# Patient Record
Sex: Female | Born: 1935 | Race: White | Hispanic: No | Smoking: Former smoker
Health system: Southern US, Community
[De-identification: ages and names within clinical notes are randomized; demographics above are authoritative.]

## PROBLEM LIST (undated history)

## (undated) DIAGNOSIS — M1990 Unspecified osteoarthritis, unspecified site: Secondary | ICD-10-CM

## (undated) HISTORY — PX: PELVIC FRACTURE SURGERY: SHX119

---

## 2012-10-13 HISTORY — PX: OTHER SURGICAL HISTORY: SHX169

## 2020-01-28 ENCOUNTER — Encounter (HOSPITAL_COMMUNITY): Payer: Self-pay | Admitting: Internal Medicine

## 2020-01-28 ENCOUNTER — Inpatient Hospital Stay (HOSPITAL_COMMUNITY): Payer: Medicare Other

## 2020-01-28 ENCOUNTER — Inpatient Hospital Stay (HOSPITAL_COMMUNITY)
Admission: AD | Admit: 2020-01-28 | Discharge: 2020-02-02 | DRG: 481 | Disposition: A | Discharge status: Skilled Nursing Facility | Payer: Medicare Other | Source: Other Acute Inpatient Hospital | Attending: Family Medicine | Admitting: Family Medicine

## 2020-01-28 DIAGNOSIS — S91302A Unspecified open wound, left foot, initial encounter: Secondary | ICD-10-CM | POA: Diagnosis present

## 2020-01-28 DIAGNOSIS — Z20822 Contact with and (suspected) exposure to covid-19: Secondary | ICD-10-CM | POA: Diagnosis present

## 2020-01-28 DIAGNOSIS — I872 Venous insufficiency (chronic) (peripheral): Secondary | ICD-10-CM | POA: Diagnosis present

## 2020-01-28 DIAGNOSIS — S91301A Unspecified open wound, right foot, initial encounter: Secondary | ICD-10-CM | POA: Diagnosis present

## 2020-01-28 DIAGNOSIS — D62 Acute posthemorrhagic anemia: Secondary | ICD-10-CM | POA: Diagnosis present

## 2020-01-28 DIAGNOSIS — M79651 Pain in right thigh: Secondary | ICD-10-CM | POA: Diagnosis present

## 2020-01-28 DIAGNOSIS — D649 Anemia, unspecified: Secondary | ICD-10-CM

## 2020-01-28 DIAGNOSIS — M810 Age-related osteoporosis without current pathological fracture: Secondary | ICD-10-CM | POA: Diagnosis present

## 2020-01-28 DIAGNOSIS — M1611 Unilateral primary osteoarthritis, right hip: Secondary | ICD-10-CM | POA: Diagnosis present

## 2020-01-28 DIAGNOSIS — Y92000 Kitchen of unspecified non-institutional (private) residence as  the place of occurrence of the external cause: Secondary | ICD-10-CM | POA: Diagnosis not present

## 2020-01-28 DIAGNOSIS — W1839XA Other fall on same level, initial encounter: Secondary | ICD-10-CM | POA: Diagnosis present

## 2020-01-28 DIAGNOSIS — Z87891 Personal history of nicotine dependence: Secondary | ICD-10-CM

## 2020-01-28 DIAGNOSIS — Z419 Encounter for procedure for purposes other than remedying health state, unspecified: Secondary | ICD-10-CM

## 2020-01-28 DIAGNOSIS — S72401A Unspecified fracture of lower end of right femur, initial encounter for closed fracture: Secondary | ICD-10-CM | POA: Diagnosis present

## 2020-01-28 HISTORY — DX: Unspecified osteoarthritis, unspecified site: M19.90

## 2020-01-28 LAB — CBC
HCT: 23.7 % — ABNORMAL LOW (ref 36.0–46.0)
Hemoglobin: 7.1 g/dL — ABNORMAL LOW (ref 12.0–15.0)
MCH: 28.6 pg (ref 26.0–34.0)
MCHC: 30 g/dL (ref 30.0–36.0)
MCV: 95.6 fL (ref 80.0–100.0)
Platelets: 279 10*3/uL (ref 150–400)
RBC: 2.48 MIL/uL — ABNORMAL LOW (ref 3.87–5.11)
RDW: 13.4 % (ref 11.5–15.5)
WBC: 11 10*3/uL — ABNORMAL HIGH (ref 4.0–10.5)
nRBC: 0 % (ref 0.0–0.2)

## 2020-01-28 LAB — BASIC METABOLIC PANEL
Anion gap: 4 — ABNORMAL LOW (ref 5–15)
BUN: 28 mg/dL — ABNORMAL HIGH (ref 8–23)
CO2: 26 mmol/L (ref 22–32)
Calcium: 8.1 mg/dL — ABNORMAL LOW (ref 8.9–10.3)
Chloride: 110 mmol/L (ref 98–111)
Creatinine, Ser: 1.1 mg/dL — ABNORMAL HIGH (ref 0.44–1.00)
GFR calc Af Amer: 52 mL/min — ABNORMAL LOW (ref >60)
GFR calc non Af Amer: 44 mL/min — ABNORMAL LOW (ref >60)
Glucose, Bld: 116 mg/dL — ABNORMAL HIGH (ref 70–99)
Potassium: 4.5 mmol/L (ref 3.5–5.1)
Sodium: 140 mmol/L (ref 135–145)

## 2020-01-28 LAB — PROTIME-INR
INR: 1.2 (ref 0.8–1.2)
Prothrombin Time: 14.7 seconds (ref 11.4–15.2)

## 2020-01-28 MED ORDER — ACETAMINOPHEN 325 MG PO TABS
650.0000 mg | ORAL_TABLET | Freq: Four times a day (QID) | ORAL | Status: DC | PRN
  Filled 2020-01-28: qty 2

## 2020-01-28 MED ORDER — MORPHINE SULFATE (PF) 2 MG/ML IV SOLN
0.5000 mg | INTRAVENOUS | Status: DC | PRN
  Administered 2020-01-29: 0.5 mg via INTRAVENOUS
  Filled 2020-01-28: qty 1

## 2020-01-28 MED ORDER — SENNOSIDES-DOCUSATE SODIUM 8.6-50 MG PO TABS: 1.0000 | ORAL_TABLET | Freq: Every evening | ORAL | Status: DC | PRN

## 2020-01-28 MED ORDER — HYDROCODONE-ACETAMINOPHEN 5-325 MG PO TABS
1.0000 | ORAL_TABLET | Freq: Four times a day (QID) | ORAL | Status: DC | PRN
  Administered 2020-01-30 – 2020-01-31 (×3): 1 via ORAL
  Filled 2020-01-28 (×4): qty 1

## 2020-01-28 NOTE — H&P (Signed)
History and Physical    Cassandra Drake SAY:[id number] DOB: [birth year] DOA: 01/28/2020  PCP: Annabell Howells, PA-C  Patient coming from: [place] have personally briefly reviewed patient's old medical records in [place]  Chief Complaint: Right leg pain after a fall  HPI: Cassandra Drake is a 86 y.o. female with medical history significant for osteoarthritis, osteoporosis, and lower extremity venous insufficiency who presented to [facility] ED for evaluation of right leg pain after a fall.  Patient states she normally ambulates with the use of a cane or walker.  She says around 5 AM on the morning of 01/28/2020 she woke up and was going to the pantry to get some coffee.  She was using a cane at the time when her right leg gave out underneath her and she landed "with the force of the Titanic" onto her right side.  She had significant pain was unable to stand on her own power.  She denies any injury to her head or loss of consciousness.  She was able to contact her son who came to her home and she was taken to [facility] ED for further evaluation.  She does state that she has been recently treated for a wound on the dorsal aspect of her left foot which she says is now healed.  Both of her feet are wrapped from her wound center.  Right hip x-ray 01/28/2020 showed a displaced fracture of the distal right femur, at the level of the distal diaphysis, with overlapping of the fracture fragments. Advanced degenerative osteoarthritis of the right hip joint noted. No displaced fracture is seen within the osseous pelvis or about the right hip.  Right knee x-ray showed a displaced and angulated oblique fracture of the distal femoral diaphysis. Mild dorsal angulation and approximately 9 mm lateral displacement of the distal fracture fragment is noted with no dislocation.  Labs were notable for INR 1.0, sodium 140, potassium 4.6, bicarb 27.6, BUN 32, creatinine 1.07, serum glucose 92, WBC  13.7, hemoglobin 7.9, platelets 319,000.  Right knee immobilizer was put in place.  The ED physician discussed the case with on-call orthopedics at [facility] who recommended transfer.  The hospitalist service was consulted to admit for further evaluation and management.  Review of Systems: All systems reviewed and are negative except as documented in history of present illness above.   Past Medical History:  Diagnosis Date  . Osteoarthritis     Past Surgical History:  Procedure Laterality Date  . Left hip surgery  2014  . PELVIC FRACTURE SURGERY      Social History:  reports that she has quit smoking. She has never used smokeless tobacco. She reports previous alcohol use. She reports previous drug use.  Not on File  Family History  Problem Relation Age of Onset  . Irregular heart beat Mother      Prior to Admission medications   Not on File    Physical Exam: Vitals:   01/28/20 2020  Pulse: 94  Resp: 16  Temp: 98.7 F (37.1 C)  TempSrc: Oral  SpO2: 94%   Constitutional: Thin elderly woman resting supine in bed, NAD, calm, comfortable Eyes: PERRL, lids and conjunctivae normal ENMT: Mucous membranes are moist. Posterior pharynx clear of any exudate or lesions. Neck: normal, supple, no masses. Respiratory: clear to auscultation anteriorly. Normal respiratory effort. No accessory muscle use.  Cardiovascular: Regular rate and rhythm, no murmurs / rubs / gallops. No extremity edema.  Abdomen: no tenderness, no  masses palpated. No hepatosplenomegaly. Bowel sounds positive.  Musculoskeletal: RLE ROM diminished due to femoral fracture and pain.  Right knee immobilizer in place.   Skin: Surgical dressing wrapping in place bilateral lower feet. Neurologic: CN 2-12 grossly intact. Sensation intact, Strength diminished RLE due to right femur fracture/pain. Psychiatric: Awake and alert.  She is oriented to self and year but not place.  Labs on Admission: I have personally  reviewed following labs and imaging studies  CBC: No results for input(s): WBC, NEUTROABS, HGB, HCT, MCV, PLT in the last 168 hours. Basic Metabolic Panel: No results for input(s): NA, K, CL, CO2, GLUCOSE, BUN, CREATININE, CALCIUM, MG, PHOS in the last 168 hours. GFR: CrCl cannot be calculated (No successful lab value found.). Liver Function Tests: No results for input(s): AST, ALT, ALKPHOS, BILITOT, PROT, ALBUMIN in the last 168 hours. No results for input(s): LIPASE, AMYLASE in the last 168 hours. No results for input(s): AMMONIA in the last 168 hours. Coagulation Profile: No results for input(s): INR, PROTIME in the last 168 hours. Cardiac Enzymes: No results for input(s): CKTOTAL, CKMB, CKMBINDEX, TROPONINI in the last 168 hours. BNP (last 3 results) No results for input(s): PROBNP in the last 8760 hours. HbA1C: No results for input(s): HGBA1C in the last 72 hours. CBG: No results for input(s): GLUCAP in the last 168 hours. Lipid Profile: No results for input(s): CHOL, HDL, LDLCALC, TRIG, CHOLHDL, LDLDIRECT in the last 72 hours. Thyroid Function Tests: No results for input(s): TSH, T4TOTAL, FREET4, T3FREE, THYROIDAB in the last 72 hours. Anemia Panel: No results for input(s): VITAMINB12, FOLATE, FERRITIN, TIBC, IRON, RETICCTPCT in the last 72 hours. Urine analysis: No results found for: COLORURINE, APPEARANCEUR, LABSPEC, PHURINE, GLUCOSEU, HGBUR, BILIRUBINUR, KETONESUR, PROTEINUR, UROBILINOGEN, NITRITE, LEUKOCYTESUR  Radiological Exams on Admission: No results found.  EKG: Ordered and pending.  Assessment/Plan Principal Problem:   Closed fracture of distal end of right femur, initial encounter (HCC) Active Problems:   Anemia  Cassandra Drake is a 86 y.o. female with medical history significant for osteoarthritis, osteoporosis, and lower extremity venous insufficiency who is admitted from [facility] ED for management of right distal femur fracture.  Right distal femur  fracture: Occurring after a mechanical fall.  She has a prior history of left hip and pelvic fractures per son.  She has no known prior history of ischemic heart disease, CHF, or cerebrovascular disease.  She would be considered a low risk surgical candidate. -Orthopedics, Dr. Susa Simmonds aware and will see patient -Repeat right femur x-rays ordered -Keep n.p.o. at midnight -Hold pharmacologic VTE prophylaxis -Continue pain control as needed with hold parameters  Anemia: Hemoglobin outside hospital was noted to be 7.9.  There is no obvious bleeding.  Will repeat labs and transfuse with PRBC if needed.  DVT prophylaxis: SCDs Code Status: Full code, confirmed with patient and son Family Communication: Discussed with son by phone Disposition Plan: From home, discharge pending orthopedic evaluation/intervention and postoperative recovery, PT/OT evaluations Consults called: Orthopedics Admission status:  Status is: Inpatient  Remains inpatient appropriate because:Requiring inpatient orthopedic evaluation/intervention for right femur fracture.   Dispo: The patient is from: Home              Anticipated d/c is to: SNF versus home pending postoperative recovery and PT/OT evaluation              Anticipated d/c date is: 3 days              Patient currently is not medically stable  to d/c.    Darreld Mclean MD [facility]  If 7PM-7AM, please contact night-coverage [email or web address]  01/28/2020, 9:45 PM

## 2020-01-29 ENCOUNTER — Encounter (HOSPITAL_COMMUNITY)
Admission: AD | Disposition: A | Discharge status: Skilled Nursing Facility | Payer: Self-pay | Source: Other Acute Inpatient Hospital | Attending: Internal Medicine

## 2020-01-29 ENCOUNTER — Inpatient Hospital Stay (HOSPITAL_COMMUNITY): Payer: Medicare Other | Admitting: Certified Registered"

## 2020-01-29 ENCOUNTER — Inpatient Hospital Stay (HOSPITAL_COMMUNITY): Payer: Medicare Other

## 2020-01-29 ENCOUNTER — Other Ambulatory Visit: Payer: Self-pay

## 2020-01-29 ENCOUNTER — Encounter (HOSPITAL_COMMUNITY): Payer: Self-pay | Admitting: Internal Medicine

## 2020-01-29 HISTORY — PX: FEMUR IM NAIL: SHX1597

## 2020-01-29 LAB — CBC
HCT: 28.6 % — ABNORMAL LOW (ref 36.0–46.0)
Hemoglobin: 8.7 g/dL — ABNORMAL LOW (ref 12.0–15.0)
MCH: 29.1 pg (ref 26.0–34.0)
MCHC: 30.4 g/dL (ref 30.0–36.0)
MCV: 95.7 fL (ref 80.0–100.0)
Platelets: 282 10*3/uL (ref 150–400)
RBC: 2.99 MIL/uL — ABNORMAL LOW (ref 3.87–5.11)
RDW: 13.7 % (ref 11.5–15.5)
WBC: 10.6 10*3/uL — ABNORMAL HIGH (ref 4.0–10.5)
nRBC: 0 % (ref 0.0–0.2)

## 2020-01-29 LAB — BASIC METABOLIC PANEL
Anion gap: 7 (ref 5–15)
BUN: 25 mg/dL — ABNORMAL HIGH (ref 8–23)
CO2: 27 mmol/L (ref 22–32)
Calcium: 8.4 mg/dL — ABNORMAL LOW (ref 8.9–10.3)
Chloride: 109 mmol/L (ref 98–111)
Creatinine, Ser: 1.12 mg/dL — ABNORMAL HIGH (ref 0.44–1.00)
GFR calc Af Amer: 50 mL/min — ABNORMAL LOW (ref >60)
GFR calc non Af Amer: 44 mL/min — ABNORMAL LOW (ref >60)
Glucose, Bld: 88 mg/dL (ref 70–99)
Potassium: 4.8 mmol/L (ref 3.5–5.1)
Sodium: 143 mmol/L (ref 135–145)

## 2020-01-29 LAB — SARS CORONAVIRUS 2 (TAT 6-24 HRS): SARS Coronavirus 2: NEGATIVE

## 2020-01-29 LAB — VITAMIN D 25 HYDROXY (VIT D DEFICIENCY, FRACTURES): Vit D, 25-Hydroxy: 50.22 ng/mL (ref 30–100)

## 2020-01-29 LAB — PREPARE RBC (CROSSMATCH)

## 2020-01-29 LAB — MRSA PCR SCREENING: MRSA by PCR: POSITIVE — AB

## 2020-01-29 LAB — ABO/RH: ABO/RH(D): A POS

## 2020-01-29 SURGERY — INSERTION, INTRAMEDULLARY ROD, FEMUR, RETROGRADE
Anesthesia: General | Laterality: Right

## 2020-01-29 MED ORDER — ACETAMINOPHEN 500 MG PO TABS: 1000.0000 mg | ORAL_TABLET | Freq: Once | ORAL | Status: DC | PRN

## 2020-01-29 MED ORDER — PROPOFOL 10 MG/ML IV BOLUS
INTRAVENOUS | Status: DC | PRN
  Administered 2020-01-29: 90 mg via INTRAVENOUS

## 2020-01-29 MED ORDER — DEXAMETHASONE SODIUM PHOSPHATE 10 MG/ML IJ SOLN
INTRAMUSCULAR | Status: AC
  Filled 2020-01-29: qty 1

## 2020-01-29 MED ORDER — DOCUSATE SODIUM 100 MG PO CAPS
100.0000 mg | ORAL_CAPSULE | Freq: Two times a day (BID) | ORAL | Status: DC
  Administered 2020-01-30 – 2020-02-02 (×7): 100 mg via ORAL
  Filled 2020-01-29 (×7): qty 1

## 2020-01-29 MED ORDER — SODIUM CHLORIDE 0.9% IV SOLUTION: Freq: Once | INTRAVENOUS | Status: AC

## 2020-01-29 MED ORDER — FENTANYL CITRATE (PF) 100 MCG/2ML IJ SOLN: 25.0000 ug | INTRAMUSCULAR | Status: DC | PRN

## 2020-01-29 MED ORDER — CEFAZOLIN SODIUM-DEXTROSE 1-4 GM/50ML-% IV SOLN
1.0000 g | Freq: Four times a day (QID) | INTRAVENOUS | Status: AC
  Administered 2020-01-29 – 2020-01-30 (×3): 1 g via INTRAVENOUS
  Filled 2020-01-29 (×3): qty 50

## 2020-01-29 MED ORDER — LIDOCAINE 2% (20 MG/ML) 5 ML SYRINGE
INTRAMUSCULAR | Status: DC | PRN
  Administered 2020-01-29: 40 mg via INTRAVENOUS

## 2020-01-29 MED ORDER — DEXAMETHASONE SODIUM PHOSPHATE 10 MG/ML IJ SOLN
INTRAMUSCULAR | Status: DC | PRN
  Administered 2020-01-29: 4 mg via INTRAVENOUS

## 2020-01-29 MED ORDER — ACETAMINOPHEN 10 MG/ML IV SOLN
1000.0000 mg | Freq: Once | INTRAVENOUS | Status: DC | PRN
  Administered 2020-01-29: 1000 mg via INTRAVENOUS

## 2020-01-29 MED ORDER — CHLORHEXIDINE GLUCONATE 4 % EX LIQD
60.0000 mL | Freq: Once | CUTANEOUS | Status: AC
  Administered 2020-01-29: 4 via TOPICAL

## 2020-01-29 MED ORDER — ONDANSETRON HCL 4 MG/2ML IJ SOLN
INTRAMUSCULAR | Status: DC | PRN
  Administered 2020-01-29: 4 mg via INTRAVENOUS

## 2020-01-29 MED ORDER — ACETAMINOPHEN 160 MG/5ML PO SOLN: 1000.0000 mg | Freq: Once | ORAL | Status: DC | PRN

## 2020-01-29 MED ORDER — PHENYLEPHRINE 40 MCG/ML (10ML) SYRINGE FOR IV PUSH (FOR BLOOD PRESSURE SUPPORT)
PREFILLED_SYRINGE | INTRAVENOUS | Status: AC
  Filled 2020-01-29: qty 10

## 2020-01-29 MED ORDER — LACTATED RINGERS IV SOLN: INTRAVENOUS | Status: DC

## 2020-01-29 MED ORDER — FENTANYL CITRATE (PF) 250 MCG/5ML IJ SOLN
INTRAMUSCULAR | Status: DC | PRN
  Administered 2020-01-29 (×3): 50 ug via INTRAVENOUS

## 2020-01-29 MED ORDER — ONDANSETRON HCL 4 MG/2ML IJ SOLN
INTRAMUSCULAR | Status: AC
  Filled 2020-01-29: qty 2

## 2020-01-29 MED ORDER — LIDOCAINE 2% (20 MG/ML) 5 ML SYRINGE
INTRAMUSCULAR | Status: AC
  Filled 2020-01-29: qty 5

## 2020-01-29 MED ORDER — MUPIROCIN 2 % EX OINT
1.0000 "application " | TOPICAL_OINTMENT | Freq: Two times a day (BID) | CUTANEOUS | Status: DC
  Administered 2020-01-29 – 2020-02-02 (×9): 1 via NASAL
  Filled 2020-01-29: qty 22

## 2020-01-29 MED ORDER — CHLORHEXIDINE GLUCONATE CLOTH 2 % EX PADS
6.0000 | MEDICATED_PAD | Freq: Every day | CUTANEOUS | Status: DC
  Administered 2020-01-30 – 2020-02-01 (×3): 6 via TOPICAL

## 2020-01-29 MED ORDER — PHENYLEPHRINE HCL-NACL 10-0.9 MG/250ML-% IV SOLN
INTRAVENOUS | Status: DC | PRN
  Administered 2020-01-29: 25 ug/min via INTRAVENOUS

## 2020-01-29 MED ORDER — PROPOFOL 10 MG/ML IV BOLUS
INTRAVENOUS | Status: AC
  Filled 2020-01-29: qty 20

## 2020-01-29 MED ORDER — OXYCODONE HCL 5 MG/5ML PO SOLN: 5.0000 mg | Freq: Once | ORAL | Status: DC | PRN

## 2020-01-29 MED ORDER — ROCURONIUM BROMIDE 10 MG/ML (PF) SYRINGE
PREFILLED_SYRINGE | INTRAVENOUS | Status: DC | PRN
  Administered 2020-01-29: 50 mg via INTRAVENOUS

## 2020-01-29 MED ORDER — 0.9 % SODIUM CHLORIDE (POUR BTL) OPTIME
TOPICAL | Status: DC | PRN
  Administered 2020-01-29: 1000 mL

## 2020-01-29 MED ORDER — ACETAMINOPHEN 10 MG/ML IV SOLN
INTRAVENOUS | Status: AC
  Filled 2020-01-29: qty 100

## 2020-01-29 MED ORDER — CEFAZOLIN SODIUM-DEXTROSE 2-4 GM/100ML-% IV SOLN
2.0000 g | INTRAVENOUS | Status: AC
  Administered 2020-01-29: 2 g via INTRAVENOUS
  Filled 2020-01-29: qty 100

## 2020-01-29 MED ORDER — OXYCODONE HCL 5 MG PO TABS: 5.0000 mg | ORAL_TABLET | Freq: Once | ORAL | Status: DC | PRN

## 2020-01-29 MED ORDER — FENTANYL CITRATE (PF) 250 MCG/5ML IJ SOLN
INTRAMUSCULAR | Status: AC
  Filled 2020-01-29: qty 5

## 2020-01-29 MED ORDER — HALOPERIDOL LACTATE 5 MG/ML IJ SOLN
5.0000 mg | Freq: Once | INTRAMUSCULAR | Status: AC
  Administered 2020-01-29: 5 mg via INTRAVENOUS
  Filled 2020-01-29: qty 1

## 2020-01-29 MED ORDER — ALBUMIN HUMAN 5 % IV SOLN: INTRAVENOUS | Status: DC | PRN

## 2020-01-29 MED ORDER — PHENYLEPHRINE 40 MCG/ML (10ML) SYRINGE FOR IV PUSH (FOR BLOOD PRESSURE SUPPORT)
PREFILLED_SYRINGE | INTRAVENOUS | Status: DC | PRN
  Administered 2020-01-29: 80 ug via INTRAVENOUS

## 2020-01-29 MED ORDER — ROCURONIUM BROMIDE 10 MG/ML (PF) SYRINGE
PREFILLED_SYRINGE | INTRAVENOUS | Status: AC
  Filled 2020-01-29: qty 30

## 2020-01-29 MED ORDER — PHENYLEPHRINE 40 MCG/ML (10ML) SYRINGE FOR IV PUSH (FOR BLOOD PRESSURE SUPPORT)
PREFILLED_SYRINGE | INTRAVENOUS | Status: AC
  Filled 2020-01-29: qty 20

## 2020-01-29 MED ORDER — SUGAMMADEX SODIUM 200 MG/2ML IV SOLN
INTRAVENOUS | Status: DC | PRN
  Administered 2020-01-29: 160 mg via INTRAVENOUS

## 2020-01-29 SURGICAL SUPPLY — 62 items
BIT DRILL CALIBRATED 4.3MMX365 (DRILL) ×1 IMPLANT
BIT DRILL CROWE PNT TWST 4.5MM (DRILL) ×2 IMPLANT
BLADE SURG 10 STRL SS (BLADE) ×6 IMPLANT
BNDG COHESIVE 4X5 TAN STRL (GAUZE/BANDAGES/DRESSINGS) IMPLANT
BNDG COHESIVE 6X5 TAN STRL LF (GAUZE/BANDAGES/DRESSINGS) ×6 IMPLANT
BNDG ELASTIC 4X5.8 VLCR STR LF (GAUZE/BANDAGES/DRESSINGS) ×3 IMPLANT
BNDG ELASTIC 6X10 VLCR STRL LF (GAUZE/BANDAGES/DRESSINGS) ×3 IMPLANT
BNDG ELASTIC 6X5.8 VLCR STR LF (GAUZE/BANDAGES/DRESSINGS) ×3 IMPLANT
BRUSH SCRUB EZ PLAIN DRY (MISCELLANEOUS) ×6 IMPLANT
CHLORAPREP W/TINT 26 (MISCELLANEOUS) ×3 IMPLANT
COVER MAYO STAND STRL (DRAPES) ×3 IMPLANT
COVER SURGICAL LIGHT HANDLE (MISCELLANEOUS) ×3 IMPLANT
COVER WAND RF STERILE (DRAPES) ×3 IMPLANT
DRAPE C-ARM 35X43 STRL (DRAPES) ×3 IMPLANT
DRAPE C-ARMOR (DRAPES) ×3 IMPLANT
DRAPE HALF SHEET 40X57 (DRAPES) ×6 IMPLANT
DRAPE IMP U-DRAPE 54X76 (DRAPES) ×6 IMPLANT
DRAPE INCISE IOBAN 66X45 STRL (DRAPES) ×3 IMPLANT
DRAPE ORTHO SPLIT 77X108 STRL (DRAPES) ×4
DRAPE SURG 17X23 STRL (DRAPES) ×3 IMPLANT
DRAPE SURG ORHT 6 SPLT 77X108 (DRAPES) ×2 IMPLANT
DRAPE U-SHAPE 47X51 STRL (DRAPES) ×3 IMPLANT
DRILL CALIBRATED 4.3MMX365 (DRILL) ×3
DRILL CROWE POINT TWIST 4.5MM (DRILL) ×6
DRSG MEPILEX BORDER 4X4 (GAUZE/BANDAGES/DRESSINGS) ×3 IMPLANT
DRSG MEPILEX BORDER 4X8 (GAUZE/BANDAGES/DRESSINGS) ×3 IMPLANT
ELECT REM PT RETURN 9FT ADLT (ELECTROSURGICAL) ×3
ELECTRODE REM PT RTRN 9FT ADLT (ELECTROSURGICAL) ×1 IMPLANT
GAUZE XEROFORM 5X9 LF (GAUZE/BANDAGES/DRESSINGS) ×3 IMPLANT
GLOVE BIOGEL M STRL SZ7.5 (GLOVE) ×3 IMPLANT
GLOVE BIOGEL PI IND STRL 8 (GLOVE) ×1 IMPLANT
GLOVE BIOGEL PI INDICATOR 8 (GLOVE) ×2
GOWN STRL REUS W/ TWL LRG LVL3 (GOWN DISPOSABLE) ×1 IMPLANT
GOWN STRL REUS W/ TWL XL LVL3 (GOWN DISPOSABLE) ×1 IMPLANT
GOWN STRL REUS W/TWL LRG LVL3 (GOWN DISPOSABLE) ×2
GOWN STRL REUS W/TWL XL LVL3 (GOWN DISPOSABLE) ×2
GUIDEPIN 3.2X17.5 THRD DISP (PIN) ×3 IMPLANT
GUIDEWIRE BEAD TIP (WIRE) ×3 IMPLANT
KIT BASIN OR (CUSTOM PROCEDURE TRAY) ×3 IMPLANT
KIT TURNOVER KIT B (KITS) ×3 IMPLANT
MANIFOLD NEPTUNE II (INSTRUMENTS) ×3 IMPLANT
NAIL FEM RETRO 9X380 (Orthopedic Implant) ×3 IMPLANT
NS IRRIG 1000ML POUR BTL (IV SOLUTION) ×3 IMPLANT
PACK GENERAL/GYN (CUSTOM PROCEDURE TRAY) ×3 IMPLANT
PAD ARMBOARD 7.5X6 YLW CONV (MISCELLANEOUS) ×6 IMPLANT
PADDING CAST COTTON 6X4 STRL (CAST SUPPLIES) ×3 IMPLANT
SCREW CORT TI DBL LEAD 5X40 (Screw) ×3 IMPLANT
SCREW CORT TI DBL LEAD 5X44 (Screw) ×3 IMPLANT
SCREW CORT TI DBL LEAD 5X46 (Screw) ×3 IMPLANT
SCREW CORT TI DBL LEAD 5X56 (Screw) ×3 IMPLANT
SCREW CORT TI DBL LEAD 5X80 (Screw) ×3 IMPLANT
SCREW CORT TI DBLE LEAD 5X52 (Screw) ×3 IMPLANT
SPONGE DRAIN TRACH 4X4 STRL 2S (GAUZE/BANDAGES/DRESSINGS) ×3 IMPLANT
STAPLER VISISTAT 35W (STAPLE) ×3 IMPLANT
STOCKINETTE IMPERVIOUS LG (DRAPES) ×3 IMPLANT
SUT MON AB 3-0 SH 27 (SUTURE) ×2
SUT MON AB 3-0 SH27 (SUTURE) ×1 IMPLANT
SUT PDS AB 2-0 CT1 27 (SUTURE) ×3 IMPLANT
TOWEL GREEN STERILE (TOWEL DISPOSABLE) ×6 IMPLANT
TOWEL GREEN STERILE FF (TOWEL DISPOSABLE) ×3 IMPLANT
UNDERPAD 30X30 (UNDERPADS AND DIAPERS) ×3 IMPLANT
WATER STERILE IRR 1000ML POUR (IV SOLUTION) ×3 IMPLANT

## 2020-01-29 NOTE — Transfer of Care (Signed)
Immediate Anesthesia Transfer of Care Note  Patient: Cassandra Drake  Procedure(s) Performed: INTRAMEDULLARY (IM) RETROGRADE FEMORAL NAILING (Right )  Patient Location: PACU  Anesthesia Type:General  Level of Consciousness: drowsy  Airway & Oxygen Therapy: Patient Spontanous Breathing and Patient connected to face mask oxygen  Post-op Assessment: Report given to RN and Post -op Vital signs reviewed and stable  Post vital signs: Reviewed and stable  Last Vitals:  Vitals Value Taken Time  BP 124/70 01/29/20 1231  Temp    Pulse 72 01/29/20 1233  Resp 9 01/29/20 1233  SpO2 100 % 01/29/20 1233  Vitals shown include unvalidated device data.  Last Pain:  Vitals:   01/29/20 1230  TempSrc:   PainSc: (P) Asleep      Patients Stated Pain Goal: 3 (01/29/20 0954)  Complications: No apparent anesthesia complications

## 2020-01-29 NOTE — Consult Note (Signed)
Reason for Consult: Right distal femur fracture Referring Physician: Emergency department  Cassandra Drake is an 86 y.o. female.  HPI: Had a fall at home.  She lives alone and uses a walker or cane at baseline.  She had immediate pain and deformity to her thigh and she was brought to the emergency department.  X-rays revealed a distal femur fracture.  She was transferred to [facility] and admitted to the hospitalist service.  Orthopedics was consulted.  She complains of pain in the right thigh.  She was placed in a knee immobilizer which makes it somewhat more comfortable.  She denies any numbness or tingling in the right lower extremity.  Pain is sharp in quality.  She notes swelling.  She denies any upper extremity or left lower extremity pain.  Initial hemoglobin was in the 7 range.  She was typed and crossed.  Hemoglobin this morning is 8.7.  She understands that she may require blood products.  She is okay with this.  Past Medical History:  Diagnosis Date  . Osteoarthritis     Past Surgical History:  Procedure Laterality Date  . Left hip surgery  2014  . PELVIC FRACTURE SURGERY      Family History  Problem Relation Age of Onset  . Irregular heart beat Mother     Social History:  reports that she has quit smoking. She has never used smokeless tobacco. She reports previous alcohol use. She reports previous drug use.  Allergies: No Known Allergies  Medications: I have reviewed the patient's current medications.  Results for orders placed or performed during the hospital encounter of 01/28/20 (from the past 48 hour(s))  Protime-INR     Status: None   Collection Time: 01/28/20  9:48 PM  Result Value Ref Range   Prothrombin Time 14.7 11.4 - 15.2 seconds   INR 1.2 0.8 - 1.2    Comment: (NOTE) INR goal varies based on device and disease states. Performed at [facility], [address], [place], [place] [address]   Basic metabolic panel     Status: Abnormal   Collection Time:  01/28/20  9:48 PM  Result Value Ref Range   Sodium 140 135 - 145 mmol/L   Potassium 4.5 3.5 - 5.1 mmol/L   Chloride 110 98 - 111 mmol/L   CO2 26 22 - 32 mmol/L   Glucose, Bld 116 (H) 70 - 99 mg/dL    Comment: Glucose reference range applies only to samples taken after fasting for at least 8 hours.   BUN 28 (H) 8 - 23 mg/dL   Creatinine, Ser 3.84 (H) 0.44 - 1.00 mg/dL   Calcium 8.1 (L) 8.9 - 10.3 mg/dL   GFR calc non Af Amer 44 (L) >60 mL/min   GFR calc Af Amer 52 (L) >60 mL/min   Anion gap 4 (L) 5 - 15    Comment: Performed at [facility], [address], [place], [place] [address]  CBC     Status: Abnormal   Collection Time: 01/28/20  9:48 PM  Result Value Ref Range   WBC 11.0 (H) 4.0 - 10.5 K/uL   RBC 2.48 (L) 3.87 - 5.11 MIL/uL   Hemoglobin 7.1 (L) 12.0 - 15.0 g/dL   HCT 80.3 (L) 21.2 - 24.8 %   MCV 95.6 80.0 - 100.0 fL   MCH 28.6 26.0 - 34.0 pg   MCHC 30.0 30.0 - 36.0 g/dL   RDW 25.0 03.7 - 04.8 %   Platelets  279 150 - 400 K/uL   nRBC 0.0 0.0 - 0.2 %    Comment: Performed at [facility], [address] [place], [place] [address]  Type and screen [facility]     Status: None (Preliminary result)   Collection Time: 01/28/20  9:48 PM  Result Value Ref Range   ABO/RH(D) A POS    Antibody Screen NEG    Sample Expiration 01/31/2020,2359    Unit Number [id number]    Blood Component Type RED CELLS,LR    Unit division 00    Status of Unit ISSUED    Transfusion Status OK TO TRANSFUSE    Crossmatch Result      Compatible Performed at [facility], [address], [place], [place] [address]   ABO/Rh     Status: None (Preliminary result)   Collection Time: 01/28/20  9:48 PM  Result Value Ref Range   ABO/RH(D)      A POS Performed at [facility], [address] [place], [place] [address]   SARS CORONAVIRUS 2 (TAT 6-24 HRS) Nasopharyngeal Nasopharyngeal Swab     Status: None   Collection Time: 01/28/20 10:26 PM   Specimen:  Nasopharyngeal Swab  Result Value Ref Range   SARS Coronavirus 2 NEGATIVE NEGATIVE    Comment: (NOTE) SARS-CoV-2 target nucleic acids are NOT DETECTED. The SARS-CoV-2 RNA is generally detectable in upper and lower respiratory specimens during the acute phase of infection. Negative results do not preclude SARS-CoV-2 infection, do not rule out co-infections with other pathogens, and should not be used as the sole basis for treatment or other patient management decisions. Negative results must be combined with clinical observations, patient history, and epidemiological information. The expected result is Negative. Fact Sheet for Patients: HairSlick.no Fact Sheet for Healthcare Providers: quierodirigir.com This test is not yet approved or cleared by the [place] FDA and  has been authorized for detection and/or diagnosis of SARS-CoV-2 by FDA under an Emergency Use Authorization (EUA). This EUA will remain  in effect (meaning this test can be used) for the duration of the COVID-19 declaration under Section 56 4(b)(1) of the Act, 21 U.S.C. section 360bbb-3(b)(1), unless the authorization is terminated or revoked sooner. Performed at [facility], [address] [place], [place] [address]   MRSA PCR Screening     Status: Abnormal   Collection Time: 01/28/20 10:28 PM   Specimen: Nasopharyngeal  Result Value Ref Range   MRSA by PCR POSITIVE (A) NEGATIVE    Comment:        The GeneXpert MRSA Assay (FDA approved for NASAL specimens only), is one component of a comprehensive MRSA colonization surveillance program. It is not intended to diagnose MRSA infection nor to guide or monitor treatment for MRSA infections. RESULT CALLED TO, READ BACK BY AND VERIFIED WITH: RIMADO,Y RN 0202 01/29/2020 MITCHELL,L   Prepare RBC (crossmatch)     Status: None   Collection Time: 01/29/20 12:13 AM  Result Value Ref Range   Order  Confirmation      ORDER PROCESSED BY BLOOD BANK Performed at [facility], [address] [place], [place] [address]   CBC     Status: Abnormal   Collection Time: 01/29/20  7:48 AM  Result Value Ref Range   WBC 10.6 (H) 4.0 - 10.5 K/uL   RBC 2.99 (L) 3.87 - 5.11 MIL/uL   Hemoglobin 8.7 (L) 12.0 - 15.0 g/dL   HCT 06.2 (L)  36.0 - 46.0 %   MCV 95.7 80.0 - 100.0 fL   MCH 29.1 26.0 - 34.0 pg   MCHC 30.4 30.0 - 36.0 g/dL   RDW 13.7 11.5 - 15.5 %   Platelets 282 150 - 400 K/uL   nRBC 0.0 0.0 - 0.2 %    Comment: Performed at [facility], [place] [address], [place], [place] [address]  Basic metabolic panel     Status: Abnormal   Collection Time: 01/29/20  7:48 AM  Result Value Ref Range   Sodium 143 135 - 145 mmol/L   Potassium 4.8 3.5 - 5.1 mmol/L   Chloride 109 98 - 111 mmol/L   CO2 27 22 - 32 mmol/L   Glucose, Bld 88 70 - 99 mg/dL    Comment: Glucose reference range applies only to samples taken after fasting for at least 8 hours.   BUN 25 (H) 8 - 23 mg/dL   Creatinine, Ser 1.12 (H) 0.44 - 1.00 mg/dL   Calcium 8.4 (L) 8.9 - 10.3 mg/dL   GFR calc non Af Amer 44 (L) >60 mL/min   GFR calc Af Amer 50 (L) >60 mL/min   Anion gap 7 5 - 15    Comment: Performed at [place] [address], [place], [place] [address]    DG FEMUR PORT, MIN 2 VIEWS RIGHT  Result Date: 01/28/2020 CLINICAL DATA:  Distal femur fracture EXAM: RIGHT FEMUR PORTABLE 2 VIEW COMPARISON:  Same day hip and knee radiographs FINDINGS: The bones are diffusely demineralized. There is a mildly comminuted spiral type fracture of the distal femur with foreshortening, lateral displacement and external rotation of the dominant distal fracture fragment. Extensive circumferential soft tissue swelling is noted about the distal femur. Alignment at the knee is grossly maintained on these nondedicated radiographs and suboptimal projection. Mild tricompartmental degenerative changes. Small to moderate knee effusion is  present. Severe osteoarthrosis of the right hip is present with extensive bony remodeling and over coverage of the acetabulum. There are sclerotic features and articular surface collapse of the femoral head which could reflect some underlying osteonecrosis. Remaining bones of the included pelvis are intact. Severe atherosclerotic calcifications are present. IMPRESSION: 1. Mildly comminuted spiral type fracture of the distal femur with foreshortening, lateral displacement and external rotation of the dominant distal fragment. 2. Severe osteoarthrosis of the right hip with sclerotic features and articular surface collapse of the femoral head which could reflect some underlying osteonecrosis. Electronically Signed   By: Lovena Le M.D.   On: 01/28/2020 22:59    Review of Systems  Constitutional: Negative.   HENT: Negative.   Eyes: Negative.   Respiratory: Negative.   Cardiovascular: Negative.   Endocrine: Negative.   Genitourinary: Negative.   Musculoskeletal:       Right thigh pain  Neurological: Negative.   Hematological: Negative.   Psychiatric/Behavioral: Negative.    Blood pressure (!) 114/46, pulse 78, temperature 98.4 F (36.9 C), temperature source Oral, resp. rate 18, SpO2 97 %. Physical Exam  Constitutional: She appears well-developed.  HENT:  Head: Normocephalic.  Eyes: Conjunctivae are normal.  Cardiovascular: Normal rate.  Respiratory: Effort normal.  GI: Soft.  Musculoskeletal:     Cervical back: Neck supple.     Comments: Patient has tenderness to palpation and deformity to the right thigh.  The swelling.  No sign of open fracture.  No tenderness palpation proximally about the hip.  No tenderness distally about the leg, ankle or foot.  She  is able to actively extend flex the ankle.  She endorses sensation light touch about the ankle.  Foot is warm and well-perfused.  No evidence of bilateral upper or left lower extremity injury.  Neurological: She is alert.  Skin: Skin  is warm.  Psychiatric: She has a normal mood and affect.    Assessment/Plan: We will proceed with retrograde nailing of her right distal femur fracture.  She has no other obvious injury.  Femoral neck looks intact but there is certainly some arthritic change within the hip joint.  She understands the risks, benefits and alternatives to surgery which include but not limited to wound healing complication, infection, nonunion, malunion, need for further surgery, damage to surrounding structures and the perioperative and anesthetic risk which include death.  Postoperatively she will be weightbearing as tolerated and will work with physical therapy.  She will likely need SNF placement as she lives alone.  She would like to proceed with surgery.  Terance Hart 01/29/2020, 10:14 AM

## 2020-01-29 NOTE — Anesthesia Postprocedure Evaluation (Signed)
Anesthesia Post Note  Patient: Cassandra Drake  Procedure(s) Performed: INTRAMEDULLARY (IM) RETROGRADE FEMORAL NAILING (Right )     Patient location during evaluation: PACU Anesthesia Type: General Level of consciousness: awake and patient cooperative Pain management: pain level controlled Vital Signs Assessment: post-procedure vital signs reviewed and stable Respiratory status: spontaneous breathing, nonlabored ventilation, respiratory function stable and patient connected to nasal cannula oxygen Cardiovascular status: blood pressure returned to baseline and stable Postop Assessment: no apparent nausea or vomiting Anesthetic complications: no    Last Vitals:  Vitals:   01/29/20 1315 01/29/20 1340  BP: 122/71 113/61  Pulse: 75 78  Resp: 16 16  Temp: 36.5 C 36.6 C  SpO2: 99% 96%    Last Pain:  Vitals:   01/29/20 1340  TempSrc: Oral  PainSc: 0-No pain                 Nicolaus Andel

## 2020-01-29 NOTE — Progress Notes (Signed)
Patient is restless, agitated and started pulling out IV and pulse oximeter. Also pt c/o chest pain and was having some delusions. Morphine 0.5 mg was given. Pt calls out a lot but refusing any nursing care from staffs. Pt tries to pull hair and swings arms to staffs. Pt sustained skin tear on her left anterior arm while resisting care from staffs. Notified TRH Bodenheimer NP of patient's behavior. Haldol 5mg  IV was given to pt per order. Pt has finally calm down and resting. Will continue to monitor patient.

## 2020-01-29 NOTE — Plan of Care (Signed)
  Problem: Education: Goal: Knowledge of General Education information will improve Description: Including pain rating scale, medication(s)/side effects and non-pharmacologic comfort measures Outcome: Progressing   Problem: Pain Managment: Goal: General experience of comfort will improve Outcome: Progressing   Problem: Safety: Goal: Ability to remain free from injury will improve Outcome: Progressing   

## 2020-01-29 NOTE — Anesthesia Preprocedure Evaluation (Signed)
Anesthesia Evaluation  Patient identified by MRN, date of birth, ID band Patient awake    Reviewed: Allergy & Precautions, NPO status , Patient's Chart, lab work & pertinent test results  History of Anesthesia Complications Negative for: history of anesthetic complications  Airway Mallampati: II  TM Distance: >3 FB Neck ROM: Full    Dental  (+) Edentulous Upper, Edentulous Lower, Dental Advisory Given   Pulmonary neg recent URI, former smoker,    breath sounds clear to auscultation       Cardiovascular negative cardio ROS   Rhythm:Regular     Neuro/Psych negative neurological ROS  negative psych ROS   GI/Hepatic negative GI ROS, Neg liver ROS,   Endo/Other  negative endocrine ROS  Renal/GU negative Renal ROS     Musculoskeletal  (+) Arthritis , RIGHT FEMUR FRACTURE   Abdominal   Peds  Hematology  (+) Blood dyscrasia, anemia ,   Anesthesia Other Findings   Reproductive/Obstetrics                             Anesthesia Physical Anesthesia Plan  ASA: I  Anesthesia Plan: General   Post-op Pain Management:    Induction: Intravenous  PONV Risk Score and Plan: 3 and Ondansetron and Dexamethasone  Airway Management Planned: Oral ETT  Additional Equipment: None  Intra-op Plan:   Post-operative Plan: Extubation in OR  Informed Consent: I have reviewed the patients History and Physical, chart, labs and discussed the procedure including the risks, benefits and alternatives for the proposed anesthesia with the patient or authorized representative who has indicated his/her understanding and acceptance.     Dental advisory given  Plan Discussed with: CRNA and Surgeon  Anesthesia Plan Comments:         Anesthesia Quick Evaluation

## 2020-01-29 NOTE — Consult Note (Signed)
WOC Nurse Consult Note: Reason for Consult:left anterior foot chronic, nonhealing wound Wound type: trauma vs venous insufficiency vs arterial insufficiency Pressure Injury POA: Yes Measurement: 4cm x 3.5cm x 0.2xm  Wound DQO:YPOD pink, moist, irregular wound egdes Drainage (amount, consistency, odor) light yellow to pale green on old dressing Periwound: intact, very dry, flaking Dressing procedure/placement/frequency: I will implement an antimicrobial, nonadherent with astringent properties, xeroform with twice daily changes. Pressure injury prevention with Prevalon boots will be implemented as well as a sacral silicone prophylactic dressing.  WOC nursing team will not follow, but will remain available to this patient, the nursing and medical teams.  Please re-consult if needed. Thanks, Ladona Mow, MSN, RN, GNP, [place]  Pager# [phone]

## 2020-01-29 NOTE — Progress Notes (Signed)
PROGRESS NOTE    Cassandra Drake  ENI:[id number] DOB: [birth year] DOA: 01/28/2020 PCP: Devra Dopp, PA-C    Brief Narrative:  Cassandra Drake is a 86 y.o. female with medical history significant for osteoarthritis, osteoporosis, and lower extremity venous insufficiency who presented to [facility] ED for evaluation of right leg pain after a fall.  Patient states she normally ambulates with the use of a cane or walker.  She says around 5 AM on the morning of 01/28/2020 she woke up and was going to the pantry to get some coffee.  She was using a cane at the time when her right leg gave out underneath her and she landed "with the force of the Titanic" onto her right side.  She had significant pain was unable to stand on her own power.  She denies any injury to her head or loss of consciousness.  She was able to contact her son who came to her home and she was taken to [facility] ED for further evaluation.  She does state that she has been recently treated for a wound on the dorsal aspect of her left foot which she says is now healed.  Both of her feet are wrapped from her wound center.  Right hip x-ray 01/28/2020 showed a displaced fracture of the distal right femur, at the level of the distal diaphysis, with overlapping of the fracture fragments. Advanced degenerative osteoarthritis of the right hip joint noted. No displaced fracture is seen within the osseous pelvis or about the right hip.  Right knee x-ray showed a displaced and angulated oblique fracture of the distal femoral diaphysis. Mild dorsal angulation and approximately 9 mm lateral displacement of the distal fracture fragment is noted with no dislocation.  Labs were notable for INR 1.0, sodium 140, potassium 4.6, bicarb 27.6, BUN 32, creatinine 1.07, serum glucose 92, WBC 13.7, hemoglobin 7.9, platelets 319,000.  Right knee immobilizer was put in place.  The ED physician discussed the case with on-call orthopedics at [facility] who recommended transfer.  The hospitalist service was consulted to admit for further evaluation and management.    Assessment & Plan:   Principal Problem:   Closed fracture of distal end of right femur, initial encounter [facility]) Active Problems:   Anemia   Right distal femur fracture Patient presented to [facility] ED following mechanical fall.  Right femur x-ray notable for mildly comminuted spiral fracture distal femur with foreshortening.  Transferred to [facility] for operative management with orthopedics.  She has a prior history of left hip and pelvic fractures per her son. --Orthopedics, Dr. Susa Simmonds following; appreciate assistance --Underwent ORIF today --Postoperative DVT prophylaxis and pain control per orthopedics --PT/OT evaluation postoperatively --Likely will need SNF placement as she lives alone  Anemia Patient was noted to have a hemoglobin of 7.9 on ED presentation at [facility], repeat hemoglobin 7.1.  Transfuse 1 unit PRBC 01/28/2020 with appropriate rise of hemoglobin to 8.7. --Continue to monitor hemoglobin daily --Transfuse for hemoglobin less than 7.0 or active bleeding   DVT prophylaxis: per orthopedics postoperatively, SCDs Code Status: Full code Family Communication: updated patients son via telephone this afternoon  Disposition Plan:  Status is: Inpatient  Remains inpatient appropriate because:Ongoing active pain requiring inpatient pain management, Unsafe d/c plan and Inpatient level of care appropriate due to severity of illness   Dispo: The patient is from: Home              Anticipated d/c is to: SNF  Anticipated d/c date is: 2 days              Patient currently is not medically stable to d/c.   Consultants:   Orthopedics - Dr. Susa Simmonds  Procedures:   ORIF Right femur 01/29/2020 - Dr. Susa Simmonds  Antimicrobials:   Operative cefazolin   Subjective: Patient seen and examined at bedside, just returned from PACU.   Sleeping but easily arousable.  Underwent ORIF right femur fracture.  Denies any pain.  No complaints currently.  Denies headache, no chest pain, palpitations, no shortness of breath, no abdominal pain.  Updated patient's son, [name] via telephone this afternoon.  Objective: Vitals:   01/29/20 1030 01/29/20 1230 01/29/20 1245 01/29/20 1300  BP: (!) 115/47 124/70 131/69 (!) 121/54  Pulse: 80 71 81 73  Resp: 13 20 19 14   Temp:  (!) 97.4 F (36.3 C)    TempSrc:      SpO2: 98% 100% 95% 97%    Intake/Output Summary (Last 24 hours) at 01/29/2020 1331 Last data filed at 01/29/2020 1250 Gross per 24 hour  Intake 1265 ml  Output 775 ml  Net 490 ml   There were no vitals filed for this visit.  Examination:  General exam: Appears calm and comfortable, thin/elderly in appearance Respiratory system: Clear to auscultation. Respiratory effort normal. Cardiovascular system: S1 & S2 heard, RRR. No JVD, murmurs, rubs, gallops or clicks. No pedal edema. Gastrointestinal system: Abdomen is nondistended, soft and nontender. No organomegaly or masses felt. Normal bowel sounds heard. Central nervous system: Alert and oriented. No focal neurological deficits. Extremities: Moving all extremities independently, surgical dressing right thigh in place, clean/dry/intact Skin: No rashes, lesions or ulcers Psychiatry: Judgement and insight appear normal. Mood & affect appropriate.     Data Reviewed: I have personally reviewed following labs and imaging studies  CBC: Recent Labs  Lab 01/28/20 2148 01/29/20 0748  WBC 11.0* 10.6*  HGB 7.1* 8.7*  HCT 23.7* 28.6*  MCV 95.6 95.7  PLT 279 282   Basic Metabolic Panel: Recent Labs  Lab 01/28/20 2148 01/29/20 0748  NA 140 143  K 4.5 4.8  CL 110 109  CO2 26 27  GLUCOSE 116* 88  BUN 28* 25*  CREATININE 1.10* 1.12*  CALCIUM 8.1* 8.4*   GFR: CrCl cannot be calculated (Unknown ideal weight.). Liver Function Tests: No results for input(s): AST,  ALT, ALKPHOS, BILITOT, PROT, ALBUMIN in the last 168 hours. No results for input(s): LIPASE, AMYLASE in the last 168 hours. No results for input(s): AMMONIA in the last 168 hours. Coagulation Profile: Recent Labs  Lab 01/28/20 2148  INR 1.2   Cardiac Enzymes: No results for input(s): CKTOTAL, CKMB, CKMBINDEX, TROPONINI in the last 168 hours. BNP (last 3 results) No results for input(s): PROBNP in the last 8760 hours. HbA1C: No results for input(s): HGBA1C in the last 72 hours. CBG: No results for input(s): GLUCAP in the last 168 hours. Lipid Profile: No results for input(s): CHOL, HDL, LDLCALC, TRIG, CHOLHDL, LDLDIRECT in the last 72 hours. Thyroid Function Tests: No results for input(s): TSH, T4TOTAL, FREET4, T3FREE, THYROIDAB in the last 72 hours. Anemia Panel: No results for input(s): VITAMINB12, FOLATE, FERRITIN, TIBC, IRON, RETICCTPCT in the last 72 hours. Sepsis Labs: No results for input(s): PROCALCITON, LATICACIDVEN in the last 168 hours.  Recent Results (from the past 240 hour(s))  SARS CORONAVIRUS 2 (TAT 6-24 HRS) Nasopharyngeal Nasopharyngeal Swab     Status: None   Collection Time: 01/28/20 10:26 PM  Specimen: Nasopharyngeal Swab  Result Value Ref Range Status   SARS Coronavirus 2 NEGATIVE NEGATIVE Final    Comment: (NOTE) SARS-CoV-2 target nucleic acids are NOT DETECTED. The SARS-CoV-2 RNA is generally detectable in upper and lower respiratory specimens during the acute phase of infection. Negative results do not preclude SARS-CoV-2 infection, do not rule out co-infections with other pathogens, and should not be used as the sole basis for treatment or other patient management decisions. Negative results must be combined with clinical observations, patient history, and epidemiological information. The expected result is Negative. Fact Sheet for Patients: HairSlick.no Fact Sheet for Healthcare  Providers: quierodirigir.com This test is not yet approved or cleared by the [place] FDA and  has been authorized for detection and/or diagnosis of SARS-CoV-2 by FDA under an Emergency Use Authorization (EUA). This EUA will remain  in effect (meaning this test can be used) for the duration of the COVID-19 declaration under Section 56 4(b)(1) of the Act, 21 U.S.C. section 360bbb-3(b)(1), unless the authorization is terminated or revoked sooner. Performed at [facility], [address], [place], [place] [address]   MRSA PCR Screening     Status: Abnormal   Collection Time: 01/28/20 10:28 PM   Specimen: Nasopharyngeal  Result Value Ref Range Status   MRSA by PCR POSITIVE (A) NEGATIVE Final    Comment:        The GeneXpert MRSA Assay (FDA approved for NASAL specimens only), is one component of a comprehensive MRSA colonization surveillance program. It is not intended to diagnose MRSA infection nor to guide or monitor treatment for MRSA infections. RESULT CALLED TO, READ BACK BY AND VERIFIED WITH: RIMADO,Y RN 0202 01/29/2020 MITCHELL,L          Radiology Studies: DG FEMUR PORT, MIN 2 VIEWS RIGHT  Result Date: 01/28/2020 CLINICAL DATA:  Distal femur fracture EXAM: RIGHT FEMUR PORTABLE 2 VIEW COMPARISON:  Same day hip and knee radiographs FINDINGS: The bones are diffusely demineralized. There is a mildly comminuted spiral type fracture of the distal femur with foreshortening, lateral displacement and external rotation of the dominant distal fracture fragment. Extensive circumferential soft tissue swelling is noted about the distal femur. Alignment at the knee is grossly maintained on these nondedicated radiographs and suboptimal projection. Mild tricompartmental degenerative changes. Small to moderate knee effusion is present. Severe osteoarthrosis of the right hip is present with extensive bony remodeling and over coverage of the acetabulum. There  are sclerotic features and articular surface collapse of the femoral head which could reflect some underlying osteonecrosis. Remaining bones of the included pelvis are intact. Severe atherosclerotic calcifications are present. IMPRESSION: 1. Mildly comminuted spiral type fracture of the distal femur with foreshortening, lateral displacement and external rotation of the dominant distal fragment. 2. Severe osteoarthrosis of the right hip with sclerotic features and articular surface collapse of the femoral head which could reflect some underlying osteonecrosis. Electronically Signed   By: Kreg Shropshire M.D.   On: 01/28/2020 22:59        Scheduled Meds: . [MAR Hold] Chlorhexidine Gluconate Cloth  6 each Topical Q0600  . [MAR Hold] mupirocin ointment  1 application Nasal BID   Continuous Infusions: . acetaminophen    . acetaminophen 1,000 mg (01/29/20 1241)  . lactated ringers 10 mL/hr at 01/29/20 0957     LOS: 1 day    Time spent: 35 minutes spent on chart review, discussion with nursing staff, consultants, updating family and interview/physical exam; more than 50% of that time  was spent in counseling and/or coordination of care.    Yarielys Beed J [place] DO [facility] Available via Epic secure chat 7am-7pm After these hours, please refer to coverage provider listed on [email or web address] 01/29/2020, 1:31 PM

## 2020-01-29 NOTE — Anesthesia Procedure Notes (Signed)
Procedure Name: Intubation Date/Time: 01/29/2020 10:56 AM Performed by: Bryson Corona, CRNA Pre-anesthesia Checklist: Patient identified, Emergency Drugs available, Suction available and Patient being monitored Patient Re-evaluated:Patient Re-evaluated prior to induction Oxygen Delivery Method: Circle System Utilized Preoxygenation: Pre-oxygenation with 100% oxygen Induction Type: IV induction Ventilation: Mask ventilation without difficulty Laryngoscope Size: Mac and 3 Grade View: Grade I Tube type: Oral Number of attempts: 1 Airway Equipment and Method: Stylet Placement Confirmation: ETT inserted through vocal cords under direct vision,  positive ETCO2 and breath sounds checked- equal and bilateral Secured at: 22 cm Tube secured with: Tape Dental Injury: Teeth and Oropharynx as per pre-operative assessment

## 2020-01-30 ENCOUNTER — Encounter: Payer: Self-pay | Admitting: *Deleted

## 2020-01-30 LAB — CBC
HCT: 23.2 % — ABNORMAL LOW (ref 36.0–46.0)
Hemoglobin: 7.2 g/dL — ABNORMAL LOW (ref 12.0–15.0)
MCH: 29.5 pg (ref 26.0–34.0)
MCHC: 31 g/dL (ref 30.0–36.0)
MCV: 95.1 fL (ref 80.0–100.0)
Platelets: 242 10*3/uL (ref 150–400)
RBC: 2.44 MIL/uL — ABNORMAL LOW (ref 3.87–5.11)
RDW: 13.5 % (ref 11.5–15.5)
WBC: 15.4 10*3/uL — ABNORMAL HIGH (ref 4.0–10.5)
nRBC: 0 % (ref 0.0–0.2)

## 2020-01-30 LAB — BASIC METABOLIC PANEL
Anion gap: 11 (ref 5–15)
BUN: 25 mg/dL — ABNORMAL HIGH (ref 8–23)
CO2: 24 mmol/L (ref 22–32)
Calcium: 8.1 mg/dL — ABNORMAL LOW (ref 8.9–10.3)
Chloride: 106 mmol/L (ref 98–111)
Creatinine, Ser: 1.13 mg/dL — ABNORMAL HIGH (ref 0.44–1.00)
GFR calc Af Amer: 50 mL/min — ABNORMAL LOW (ref >60)
GFR calc non Af Amer: 43 mL/min — ABNORMAL LOW (ref >60)
Glucose, Bld: 92 mg/dL (ref 70–99)
Potassium: 4.5 mmol/L (ref 3.5–5.1)
Sodium: 141 mmol/L (ref 135–145)

## 2020-01-30 LAB — HEMOGLOBIN AND HEMATOCRIT, BLOOD
HCT: 28.2 % — ABNORMAL LOW (ref 36.0–46.0)
Hemoglobin: 8.7 g/dL — ABNORMAL LOW (ref 12.0–15.0)

## 2020-01-30 LAB — PREPARE RBC (CROSSMATCH)

## 2020-01-30 MED ORDER — QUETIAPINE FUMARATE 25 MG PO TABS
12.5000 mg | ORAL_TABLET | Freq: Every day | ORAL | Status: DC
  Administered 2020-01-30: 12.5 mg via ORAL
  Filled 2020-01-30: qty 1

## 2020-01-30 MED ORDER — SODIUM CHLORIDE 0.9% IV SOLUTION: Freq: Once | INTRAVENOUS | Status: AC

## 2020-01-30 MED ORDER — DIPHENHYDRAMINE HCL 25 MG PO CAPS
25.0000 mg | ORAL_CAPSULE | Freq: Four times a day (QID) | ORAL | Status: DC | PRN
  Administered 2020-01-30 – 2020-01-31 (×3): 25 mg via ORAL
  Filled 2020-01-30 (×4): qty 1

## 2020-01-30 MED ORDER — MELATONIN 3 MG PO TABS
3.0000 mg | ORAL_TABLET | Freq: Every day | ORAL | Status: DC
  Administered 2020-01-30 – 2020-02-01 (×3): 3 mg via ORAL
  Filled 2020-01-30 (×3): qty 1

## 2020-01-30 NOTE — Evaluation (Signed)
Physical Therapy Evaluation Patient Details Name: Cassandra Drake MRN: [id number] DOB: 05-08-1928 Today's Date: 01/30/2020   History of Present Illness  86 y.o. female with medical history significant for osteoarthritis, osteoporosis, and lower extremity venous insufficiency who presented to [facility] ED for evaluation of right leg pain after a fall. Right knee x-ray showed a displaced and angulated oblique fracture of the distal femoral diaphysis. underwent IM nail 01/29/20  Clinical Impression   Patient is s/p above surgery resulting in functional limitations due to the deficits listed below (see PT Problem List). Patient lives alone and reports multiple falls due to rt knee buckling. Today she is limited by weakness, decreased ROM bil LEs, decr balance and pain in RLE. She required 2 person max assist for supine to sit and transfer to the chair. She does not understand why she cannot go home with intermittent assist from her son, however agrees to SNF for therapy "because that's what he (son) says I need to do."  Patient will benefit from skilled PT to increase their independence and safety with mobility to allow discharge to the venue listed below.          Follow Up Recommendations SNF    Equipment Recommendations  None recommended by PT    Recommendations for Other Services       Precautions / Restrictions Precautions Precautions: Fall Precaution Comments: pt reports rt knee buckles with multiple falls PTA Restrictions RLE Weight Bearing: Weight bearing as tolerated      Mobility  Bed Mobility Overal bed mobility: Needs Assistance Bed Mobility: Supine to Sit     Supine to sit: Max assist;+2 for physical assistance;HOB elevated     General bed mobility comments: max assist to laterally scoot hips toward EOB, max assist to help legs over EOB and +2 max assist to raise torso to sitting; total assist to scoot forward in sitting to get feet on the floor    Transfers Overall transfer level: Needs assistance Equipment used: Rolling walker (2 wheeled) Transfers: Sit to/from Stand Sit to Stand: Max assist;+2 physical assistance;+2 safety/equipment         General transfer comment: pt with difficulty bringing Lt foot underneath her due to PF contracture, therefore requiring incr assist to initiate sit to stand; never achieves full knee extension in either LE in standing  Ambulation/Gait Ambulation/Gait assistance: Mod assist;+2 physical assistance;+2 safety/equipment Gait Distance (Feet): 2 Feet Assistive device: Rolling walker (2 wheeled) Gait Pattern/deviations: Step-to pattern;Decreased stride length;Trunk flexed     General Gait Details: pt able to advance her feet, however incr assist to maintain standing with flexed posture and flexed at knees  Stairs            Wheelchair Mobility    Modified Rankin (Stroke Patients Only)       Balance Overall balance assessment: Needs assistance Sitting-balance support: Single extremity supported;Feet supported Sitting balance-Leahy Scale: Poor Sitting balance - Comments: needs bil UE support to maintain balance   Standing balance support: Bilateral upper extremity supported Standing balance-Leahy Scale: Poor Standing balance comment: poor extension of knees with posterior lean                              Pertinent Vitals/Pain Pain Assessment: Faces(pt unable to rate with #) Faces Pain Scale: Hurts even more Pain Location: rt thigh Pain Descriptors / Indicators: Discomfort;Guarding;Grimacing Pain Intervention(s): Limited activity within patient's tolerance;Monitored during session;Repositioned(offered to ask RN for pain meds;  pt refused x 2)    Home Living Family/patient expects to be discharged to:: Private residence Living Arrangements: Alone Available Help at Discharge: Family(son; works in [place]) Type of Home: House Home Access: Stairs to  enter Entrance Stairs-Rails: None Technical brewer of Steps: 2 Home Layout: Two level;Bed/bath upstairs [place] - single point;Walker - 4 wheels      Prior Function Level of Independence: Independent with assistive device(s)         Comments: has been doing her bath at the sink for awhile; walks with cane; reports multiple falls due to rt knee buckles     Hand Dominance        Extremity/Trunk Assessment   Upper Extremity Assessment Upper Extremity Assessment: Generalized weakness;Defer to OT evaluation    Lower Extremity Assessment Lower Extremity Assessment: RLE deficits/detail;LLE deficits/detail;Generalized weakness RLE Deficits / Details: wrapped in ace wrap with ~15 degrees knee flexion with pt unable to tolerate full extension once pillow removed; ankle WNL LLE Deficits / Details: initially did not display full knee extension, however with HOB lowered and tactile cues, she understood and relaxed into full knee extension; supine knee flexion at least 120; ankle PF contracture ~20 degrees which limits ability to get left foot underneath her for sit to stand    Cervical / Trunk Assessment Cervical / Trunk Assessment: Kyphotic  Communication   Communication: No difficulties  Cognition Arousal/Alertness: Awake/alert Behavior During Therapy: Anxious Overall Cognitive Status: No family/caregiver present to determine baseline cognitive functioning                                 General Comments: oriented x 3 (time NT); did not seem to understand why she could not go home and have her son help intermittently (when not working); poor awareness of deficits; poor safety awareness      General Comments General comments (skin integrity, edema, etc.): Patient eager to participate and get OOB "I've been waiting for this all day!" Reports she is always incontinent of urine and normally wears "Depends" Folded bed pad used during transfer with no  leakage noted. Bed was wet.     Exercises     Assessment/Plan    PT Assessment Patient needs continued PT services  PT Problem List Decreased strength;Decreased range of motion;Decreased activity tolerance;Decreased balance;Decreased mobility;Decreased cognition;Decreased knowledge of use of DME;Decreased safety awareness;Pain       PT Treatment Interventions DME instruction;Gait training;Functional mobility training;Therapeutic activities;Therapeutic exercise;Patient/family education    PT Goals (Current goals can be found in the Care Plan section)  Acute Rehab PT Goals Patient Stated Goal: ultimately to go home PT Goal Formulation: With patient Time For Goal Achievement: 02/13/20 Potential to Achieve Goals: Good    Frequency Min 3X/week   Barriers to discharge Inaccessible home environment;Decreased caregiver support 2 steps with no rails; bedroom upstairs; no 24 hour care    Co-evaluation               AM-PAC PT "6 Clicks" Mobility  Outcome Measure Help needed turning from your back to your side while in a flat bed without using bedrails?: Total Help needed moving from lying on your back to sitting on the side of a flat bed without using bedrails?: Total Help needed moving to and from a bed to a chair (including a wheelchair)?: A Lot Help needed standing up from a chair using your arms (e.g., wheelchair or bedside chair)?: A  Lot Help needed to walk in hospital room?: Total Help needed climbing 3-5 steps with a railing? : Total 6 Click Score: 8    End of Session Equipment Utilized During Treatment: Gait belt Activity Tolerance: Patient limited by pain Patient left: in chair;with call bell/phone within reach;with chair alarm set Nurse Communication: Mobility status;Other (comment)(incontinent with wet bed) PT Visit Diagnosis: Other abnormalities of gait and mobility (R26.89);Repeated falls (R29.6);Muscle weakness (generalized) (M62.81);Pain Pain - Right/Left:  Right Pain - part of body: Leg    Time: 6384-6659 PT Time Calculation (min) (ACUTE ONLY): 31 min   Charges:   PT Evaluation $PT Eval Low Complexity: 1 Low PT Treatments $Therapeutic Activity: 8-22 mins         Cassandra Drake, PT Pager [phone]   Cassandra Drake 01/30/2020, 4:28 PM

## 2020-01-30 NOTE — Progress Notes (Signed)
Patient is complaining of itching, patient  Removed all the surgical dressing off, nurse reinforced the dressing and notify attending doctor, will wait for orders and continue to monitor

## 2020-01-30 NOTE — Plan of Care (Signed)
  Problem: Education: Goal: Verbalization of understanding the information provided (i.e., activity precautions, restrictions, etc) will improve Outcome: Adequate for Discharge Goal: Individualized Educational Video(s) Outcome: Adequate for Discharge   Problem: Activity: Goal: Ability to ambulate and perform ADLs will improve Outcome: Adequate for Discharge   

## 2020-01-30 NOTE — Progress Notes (Signed)
     Cassandra Drake is a 86 y.o. female   Orthopaedic diagnosis: Right distal femur fracture status post intramedullary nail  Subjective: Patient is awake and alert this morning.  Denies pain.  Is asking for breakfast and coffee.  Has not mobilized with physical therapy.  Did have some agitation overnight requiring Haldol.  She was seen by wound care for her bilateral foot wounds and dressing was placed.  Objectyive: Vitals:   01/30/20 0300 01/30/20 0830  BP: 111/62 (!) 113/55  Pulse: 70 79  Resp: 15 16  Temp: 98.1 F (36.7 C) 98.2 F (36.8 C)  SpO2: 94% 96%     Exam: Awake and alert Respirations even and unlabored No acute distress  Right knee dressing in place without saturation.  Right hip dressing in place with slight shadowing.  She has a hip flexion contracture.  She is able to wiggle her toes bilaterally.  Endorses sensation light touch about toes.  Hemoglobin: 7.2 -type and cross has been performed  Assessment: Postoperative day 1 status post intramedullary nailing of right distal femur fracture, doing well   Plan: She may weight-bear as tolerated on her right lower extremity.   Bilateral foot wounds per wound care.  Mobilize with physical therapy. Keep dressing in place and bolster if needed Patient is anemic today is 7.2.  Will defer transfusion to hospitalist team.  She will follow-up in 2 weeks for wound check, x-rays and suture removal if appropriate.     Nicki [place], MD

## 2020-01-30 NOTE — Progress Notes (Signed)
PROGRESS NOTE    Cassandra Drake  JKD:[id number] DOB: [birth year] DOA: 01/28/2020 PCP: Devra Dopp, PA-C    Brief Narrative:  Cassandra Drake is a 86 y.o. female with medical history significant for osteoarthritis, osteoporosis, and lower extremity venous insufficiency who presented to [facility] ED for evaluation of right leg pain after a fall.  Patient states she normally ambulates with the use of a cane or walker.  She says around 5 AM on the morning of 01/28/2020 she woke up and was going to the pantry to get some coffee.  She was using a cane at the time when her right leg gave out underneath her and she landed "with the force of the Titanic" onto her right side.  She had significant pain was unable to stand on her own power.  She denies any injury to her head or loss of consciousness.  She was able to contact her son who came to her home and she was taken to [facility] ED for further evaluation.  She does state that she has been recently treated for a wound on the dorsal aspect of her left foot which she says is now healed.  Both of her feet are wrapped from her wound center.  Right hip x-ray 01/28/2020 showed a displaced fracture of the distal right femur, at the level of the distal diaphysis, with overlapping of the fracture fragments. Advanced degenerative osteoarthritis of the right hip joint noted. No displaced fracture is seen within the osseous pelvis or about the right hip.  Right knee x-ray showed a displaced and angulated oblique fracture of the distal femoral diaphysis. Mild dorsal angulation and approximately 9 mm lateral displacement of the distal fracture fragment is noted with no dislocation.  Labs were notable for INR 1.0, sodium 140, potassium 4.6, bicarb 27.6, BUN 32, creatinine 1.07, serum glucose 92, WBC 13.7, hemoglobin 7.9, platelets 319,000.  Right knee immobilizer was put in place.  The ED physician discussed the case with on-call orthopedics at [facility] who recommended transfer.  The hospitalist service was consulted to admit for further evaluation and management.    Assessment & Plan:   Principal Problem:   Closed fracture of distal end of right femur, initial encounter [facility]) Active Problems:   Anemia   Right distal femur fracture Patient presented to [facility] ED following mechanical fall.  Right femur x-ray notable for mildly comminuted spiral fracture distal femur with foreshortening.  Transferred to [facility] for operative management with orthopedics.  She has a prior history of left hip and pelvic fractures per her son.  Patient underwent ORIF/IM nail on 01/29/2020 by Dr. Susa Simmonds. --Postoperative DVT prophylaxis and pain control per orthopedics --PT/OT evaluation pending --Likely will need SNF placement as she lives alone  Anemia Acute postoperative blood loss anemia Patient was noted to have a hemoglobin of 7.9 on ED presentation at [facility], repeat hemoglobin 7.1.  Transfused 1 unit PRBC 01/28/2020 with appropriate rise of hemoglobin to 8.7. --Hgb 7.9-->7.1-->8.7-->7.2 this morning --Transfuse 1 unit PRBC today --Continue to monitor hemoglobin daily --Transfuse for hemoglobin less than 7.0 or active bleeding   DVT prophylaxis: per orthopedics postoperatively, SCDs Code Status: Full code Family Communication: updated patients son via telephone this afternoon  Disposition Plan:  Status is: Inpatient  Remains inpatient appropriate because:Ongoing active pain requiring inpatient pain management, Unsafe d/c plan and Inpatient level of care appropriate due to severity of illness   Dispo: The patient is from: Home  Anticipated d/c is to: SNF              Anticipated d/c date is: 2 days              Patient currently is not medically stable to d/c.   Consultants:   Orthopedics - Dr. Lucia Gaskins  Procedures:   ORIF Right femur 01/29/2020 - Dr. Lucia Gaskins  Antimicrobials:   Operative  cefazolin   Subjective: Patient seen and examined at bedside, resting comfortably.  Denies pain.  Hemoglobin down from 8.7 to 7.2 postoperatively today.  Will transfuse 1 unit PRBC. No complaints this morning other than needs help ordering her breakfast.  Denies headache, no chest pain, palpitations, no shortness of breath, no abdominal pain.  Updated patient's son, Legrand Como via telephone this morning.  Denies headache, no fever/chills/night sweats, no nausea/vomiting/diarrhea, no chest pain, palpitations, no abdominal pain, no shortness of breath, no weakness, no fatigue, no paresthesias.  Objective: Vitals:   01/30/20 0300 01/30/20 0830 01/30/20 1121 01/30/20 1144  BP: 111/62 (!) 113/55 (!) 128/58 130/71  Pulse: 70 79 86 88  Resp: 15 16 16 16   Temp: 98.1 F (36.7 C) 98.2 F (36.8 C) 98.3 F (36.8 C) 98.5 F (36.9 C)  TempSrc: Oral Oral Oral Oral  SpO2: 94% 96% 98% 98%  Weight:      Height:        Intake/Output Summary (Last 24 hours) at 01/30/2020 1211 Last data filed at 01/30/2020 0900 Gross per 24 hour  Intake 870 ml  Output 125 ml  Net 745 ml   Filed Weights   01/29/20 1340  Weight: 43.9 kg    Examination:  General exam: Appears calm and comfortable, thin/elderly in appearance Respiratory system: Clear to auscultation. Respiratory effort normal. Cardiovascular system: S1 & S2 heard, RRR. No JVD, murmurs, rubs, gallops or clicks. No pedal edema. Gastrointestinal system: Abdomen is nondistended, soft and nontender. No organomegaly or masses felt. Normal bowel sounds heard. Central nervous system: Alert and oriented. No focal neurological deficits. Extremities: Moving all extremities independently, surgical dressing right thigh in place with some old blood noted upper surgical wound, otherwise clean/dry/intact Skin: No rashes, lesions or ulcers Psychiatry: Judgement and insight appear normal. Mood & affect appropriate.     Data Reviewed: I have personally reviewed  following labs and imaging studies  CBC: Recent Labs  Lab 01/28/20 2148 01/29/20 0748 01/30/20 0552  WBC 11.0* 10.6* 15.4*  HGB 7.1* 8.7* 7.2*  HCT 23.7* 28.6* 23.2*  MCV 95.6 95.7 95.1  PLT 279 282 161   Basic Metabolic Panel: Recent Labs  Lab 01/28/20 2148 01/29/20 0748 01/30/20 0552  NA 140 143 141  K 4.5 4.8 4.5  CL 110 109 106  CO2 26 27 24   GLUCOSE 116* 88 92  BUN 28* 25* 25*  CREATININE 1.10* 1.12* 1.13*  CALCIUM 8.1* 8.4* 8.1*   GFR: Estimated Creatinine Clearance: 23.4 mL/min (A) (by C-G formula based on SCr of 1.13 mg/dL (H)). Liver Function Tests: No results for input(s): AST, ALT, ALKPHOS, BILITOT, PROT, ALBUMIN in the last 168 hours. No results for input(s): LIPASE, AMYLASE in the last 168 hours. No results for input(s): AMMONIA in the last 168 hours. Coagulation Profile: Recent Labs  Lab 01/28/20 2148  INR 1.2   Cardiac Enzymes: No results for input(s): CKTOTAL, CKMB, CKMBINDEX, TROPONINI in the last 168 hours. BNP (last 3 results) No results for input(s): PROBNP in the last 8760 hours. HbA1C: No results for input(s): HGBA1C in the last  72 hours. CBG: No results for input(s): GLUCAP in the last 168 hours. Lipid Profile: No results for input(s): CHOL, HDL, LDLCALC, TRIG, CHOLHDL, LDLDIRECT in the last 72 hours. Thyroid Function Tests: No results for input(s): TSH, T4TOTAL, FREET4, T3FREE, THYROIDAB in the last 72 hours. Anemia Panel: No results for input(s): VITAMINB12, FOLATE, FERRITIN, TIBC, IRON, RETICCTPCT in the last 72 hours. Sepsis Labs: No results for input(s): PROCALCITON, LATICACIDVEN in the last 168 hours.  Recent Results (from the past 240 hour(s))  SARS CORONAVIRUS 2 (TAT 6-24 HRS) Nasopharyngeal Nasopharyngeal Swab     Status: None   Collection Time: 01/28/20 10:26 PM   Specimen: Nasopharyngeal Swab  Result Value Ref Range Status   SARS Coronavirus 2 NEGATIVE NEGATIVE Final    Comment: (NOTE) SARS-CoV-2 target nucleic acids  are NOT DETECTED. The SARS-CoV-2 RNA is generally detectable in upper and lower respiratory specimens during the acute phase of infection. Negative results do not preclude SARS-CoV-2 infection, do not rule out co-infections with other pathogens, and should not be used as the sole basis for treatment or other patient management decisions. Negative results must be combined with clinical observations, patient history, and epidemiological information. The expected result is Negative. Fact Sheet for Patients: HairSlick.no Fact Sheet for Healthcare Providers: quierodirigir.com This test is not yet approved or cleared by the [place] FDA and  has been authorized for detection and/or diagnosis of SARS-CoV-2 by FDA under an Emergency Use Authorization (EUA). This EUA will remain  in effect (meaning this test can be used) for the duration of the COVID-19 declaration under Section 56 4(b)(1) of the Act, 21 U.S.C. section 360bbb-3(b)(1), unless the authorization is terminated or revoked sooner. Performed at [facility], [address] [place], [place] [address]   MRSA PCR Screening     Status: Abnormal   Collection Time: 01/28/20 10:28 PM   Specimen: Nasopharyngeal  Result Value Ref Range Status   MRSA by PCR POSITIVE (A) NEGATIVE Final    Comment:        The GeneXpert MRSA Assay (FDA approved for NASAL specimens only), is one component of a comprehensive MRSA colonization surveillance program. It is not intended to diagnose MRSA infection nor to guide or monitor treatment for MRSA infections. RESULT CALLED TO, READ BACK BY AND VERIFIED WITH: RIMADO,Y RN 0202 01/29/2020 MITCHELL,L          Radiology Studies: DG Knee 1-2 Views Right  Result Date: 01/29/2020 CLINICAL DATA:  Status post fall. EXAM: DG C-ARM 1-60 MIN; RIGHT KNEE - 1-2 VIEW FLUOROSCOPY TIME:  Fluoroscopy Time:  59 seconds Number of Acquired Spot Images:  10 COMPARISON:  None. FINDINGS: Fluoroscopic images demonstrating intramedullary rod placement in the RIGHT femur, traversing the RIGHT femur fracture site. Rod and screws appear intact and appropriately positioned. Fluoroscopy provided for 59 seconds. IMPRESSION: Intraoperative fluoroscopic images demonstrating intramedullary rod placement in the RIGHT femur, traversing the RIGHT femur fracture site. Electronically Signed   By: Bary Richard M.D.   On: 01/29/2020 15:39   DG C-Arm 1-60 Min  Result Date: 01/29/2020 CLINICAL DATA:  Status post fall. EXAM: DG C-ARM 1-60 MIN; RIGHT KNEE - 1-2 VIEW FLUOROSCOPY TIME:  Fluoroscopy Time:  59 seconds Number of Acquired Spot Images: 10 COMPARISON:  None. FINDINGS: Fluoroscopic images demonstrating intramedullary rod placement in the RIGHT femur, traversing the RIGHT femur fracture site. Rod and screws appear intact and appropriately positioned. Fluoroscopy provided for 59 seconds. IMPRESSION: Intraoperative fluoroscopic images demonstrating intramedullary rod placement in the  RIGHT femur, traversing the RIGHT femur fracture site. Electronically Signed   By: Bary Richard M.D.   On: 01/29/2020 15:39   DG FEMUR PORT, MIN 2 VIEWS RIGHT  Result Date: 01/28/2020 CLINICAL DATA:  Distal femur fracture EXAM: RIGHT FEMUR PORTABLE 2 VIEW COMPARISON:  Same day hip and knee radiographs FINDINGS: The bones are diffusely demineralized. There is a mildly comminuted spiral type fracture of the distal femur with foreshortening, lateral displacement and external rotation of the dominant distal fracture fragment. Extensive circumferential soft tissue swelling is noted about the distal femur. Alignment at the knee is grossly maintained on these nondedicated radiographs and suboptimal projection. Mild tricompartmental degenerative changes. Small to moderate knee effusion is present. Severe osteoarthrosis of the right hip is present with extensive bony remodeling and over coverage of the  acetabulum. There are sclerotic features and articular surface collapse of the femoral head which could reflect some underlying osteonecrosis. Remaining bones of the included pelvis are intact. Severe atherosclerotic calcifications are present. IMPRESSION: 1. Mildly comminuted spiral type fracture of the distal femur with foreshortening, lateral displacement and external rotation of the dominant distal fragment. 2. Severe osteoarthrosis of the right hip with sclerotic features and articular surface collapse of the femoral head which could reflect some underlying osteonecrosis. Electronically Signed   By: Kreg Shropshire M.D.   On: 01/28/2020 22:59        Scheduled Meds: . Chlorhexidine Gluconate Cloth  6 each Topical Q0600  . docusate sodium  100 mg Oral BID  . melatonin  3 mg Oral QHS  . mupirocin ointment  1 application Nasal BID  . QUEtiapine  12.5 mg Oral QHS   Continuous Infusions:    LOS: 2 days    Time spent: 37 minutes spent on chart review, discussion with nursing staff, consultants, updating family and interview/physical exam; more than 50% of that time was spent in counseling and/or coordination of care.    Alvira Philips [place], DO [facility] Available via Epic secure chat 7am-7pm After these hours, please refer to coverage provider listed on [email or web address] 01/30/2020, 12:11 PM

## 2020-01-30 NOTE — Plan of Care (Signed)
  Problem: Education: Goal: Verbalization of understanding the information provided (i.e., activity precautions, restrictions, etc) will improve Outcome: Progressing   Problem: Self-Concept: Goal: Ability to maintain and perform role responsibilities to the fullest extent possible will improve Outcome: Progressing   Problem: Pain Management: Goal: Pain level will decrease Outcome: Progressing   

## 2020-01-30 NOTE — Progress Notes (Addendum)
PT Cancellation Note  Patient Details Name: Cassandra Drake MRN: [id number] DOB: [birth year]   Cancelled Treatment:    Reason Eval/Treat Not Completed: Medical issues which prohibited therapy. Pt with 7.2 Hgb. She is receiving 1 unit PRBC. PT to re-attempt eval as time allows.   Ilda Foil 01/30/2020, 11:56 AM   Aida Raider, PT  Office # [phone] Pager [phone]

## 2020-01-31 LAB — BASIC METABOLIC PANEL
Anion gap: 10 (ref 5–15)
BUN: 22 mg/dL (ref 8–23)
CO2: 25 mmol/L (ref 22–32)
Calcium: 8.5 mg/dL — ABNORMAL LOW (ref 8.9–10.3)
Chloride: 107 mmol/L (ref 98–111)
Creatinine, Ser: 1.05 mg/dL — ABNORMAL HIGH (ref 0.44–1.00)
GFR calc Af Amer: 55 mL/min — ABNORMAL LOW (ref >60)
GFR calc non Af Amer: 47 mL/min — ABNORMAL LOW (ref >60)
Glucose, Bld: 117 mg/dL — ABNORMAL HIGH (ref 70–99)
Potassium: 4.2 mmol/L (ref 3.5–5.1)
Sodium: 142 mmol/L (ref 135–145)

## 2020-01-31 LAB — CBC
HCT: 28.6 % — ABNORMAL LOW (ref 36.0–46.0)
Hemoglobin: 8.9 g/dL — ABNORMAL LOW (ref 12.0–15.0)
MCH: 28.8 pg (ref 26.0–34.0)
MCHC: 31.1 g/dL (ref 30.0–36.0)
MCV: 92.6 fL (ref 80.0–100.0)
Platelets: 246 10*3/uL (ref 150–400)
RBC: 3.09 MIL/uL — ABNORMAL LOW (ref 3.87–5.11)
RDW: 14.7 % (ref 11.5–15.5)
WBC: 13 10*3/uL — ABNORMAL HIGH (ref 4.0–10.5)
nRBC: 0 % (ref 0.0–0.2)

## 2020-01-31 LAB — BPAM RBC
Blood Product Expiration Date: 202105082359
Blood Product Expiration Date: 202105252359
ISSUE DATE / TIME: 202104180114
ISSUE DATE / TIME: 202104191110
Unit Type and Rh: 6200
Unit Type and Rh: 6200

## 2020-01-31 LAB — TYPE AND SCREEN
ABO/RH(D): A POS
Antibody Screen: NEGATIVE
Unit division: 0
Unit division: 0

## 2020-01-31 LAB — MAGNESIUM: Magnesium: 1.9 mg/dL (ref 1.7–2.4)

## 2020-01-31 MED ORDER — QUETIAPINE FUMARATE 25 MG PO TABS
25.0000 mg | ORAL_TABLET | Freq: Every day | ORAL | Status: DC
  Administered 2020-01-31 – 2020-02-01 (×2): 25 mg via ORAL
  Filled 2020-01-31 (×2): qty 1

## 2020-01-31 NOTE — Evaluation (Signed)
Occupational Therapy Evaluation Patient Details Name: Cassandra Drake MRN: [id number] DOB: 1928-09-16 Today's Date: 01/31/2020    History of Present Illness 86 y.o. female with medical history significant for osteoarthritis, osteoporosis, and lower extremity venous insufficiency who presented to [facility] ED for evaluation of right leg pain after a fall. Right knee x-ray showed a displaced and angulated oblique fracture of the distal femoral diaphysis. underwent IM nail 01/29/20   Clinical Impression   Per chart, PTA, pt was living at home alone, and was independent with ADL/IADL and functional mobility. Pt currently demonstrates cognitive and physical limitations impacting safety and independence with ADL and functional mobility. Pt required maxA to progress to EOB, she required modA+2 for stand-pivot to the recliner. Due to decline in current level of function, pt would benefit from acute OT to address established goals to facilitate safe D/C to venue listed below. At this time, recommend SNF follow-up. Will continue to follow acutely.     Follow Up Recommendations  SNF;Supervision/Assistance - 24 hour    Equipment Recommendations  3 in 1 bedside commode    Recommendations for Other Services       Precautions / Restrictions Precautions Precautions: Fall Precaution Comments: per chart rt knee buckles with multiple falls PTA Restrictions Weight Bearing Restrictions: Yes RLE Weight Bearing: Weight bearing as tolerated      Mobility Bed Mobility Overal bed mobility: Needs Assistance Bed Mobility: Supine to Sit     Supine to sit: Max assist;HOB elevated     General bed mobility comments: maxA to progress BLE and trunk   Transfers Overall transfer level: Needs assistance Equipment used: Rolling walker (2 wheeled) Transfers: Sit to/from Stand Sit to Stand: Mod assist;+2 physical assistance;+2 safety/equipment         General transfer comment: pt with posterior lean,  modA+2 to progress into standing    Balance Overall balance assessment: Needs assistance Sitting-balance support: Single extremity supported;Feet supported Sitting balance-Leahy Scale: Poor Sitting balance - Comments: needs bil UE support to maintain balance Postural control: Posterior lean Standing balance support: Bilateral upper extremity supported Standing balance-Leahy Scale: Poor Standing balance comment: poor extension of knees with posterior lean                            ADL either performed or assessed with clinical judgement   ADL Overall ADL's : Needs assistance/impaired Eating/Feeding: Set up;Sitting   Grooming: Minimal assistance;Sitting   Upper Body Bathing: Minimal assistance;Sitting   Lower Body Bathing: Moderate assistance;Sit to/from stand   Upper Body Dressing : Minimal assistance;Sitting Upper Body Dressing Details (indicate cue type and reason): minA to don/doff gown Lower Body Dressing: Moderate assistance;Sit to/from stand   Toilet Transfer: Moderate assistance;Stand-pivot;RW Toilet Transfer Details (indicate cue type and reason): modA for stability with transfer to recliner Toileting- Clothing Manipulation and Hygiene: Maximal assistance;Sit to/from stand;+2 for physical assistance;+2 for safety/equipment Toileting - Clothing Manipulation Details (indicate cue type and reason): pt was incontinent of urine, max A for care     Functional mobility during ADLs: Moderate assistance;+2 for physical assistance;+2 for safety/equipment;Rolling walker General ADL Comments: pt limited by cognition, decreased stability, generalized weakness      Vision         Perception     Praxis      Pertinent Vitals/Pain Pain Assessment: Faces Faces Pain Scale: Hurts little more Pain Location: rt thigh Pain Descriptors / Indicators: Discomfort;Guarding;Grimacing Pain Intervention(s): Limited activity within patient's tolerance;Monitored during  session;Repositioned     Hand Dominance Right   Extremity/Trunk Assessment Upper Extremity Assessment Upper Extremity Assessment: Generalized weakness   Lower Extremity Assessment Lower Extremity Assessment: Defer to PT evaluation   Cervical / Trunk Assessment Cervical / Trunk Assessment: Kyphotic   Communication Communication Communication: No difficulties   Cognition Arousal/Alertness: Awake/alert Behavior During Therapy: WFL for tasks assessed/performed Overall Cognitive Status: No family/caregiver present to determine baseline cognitive functioning Area of Impairment: Orientation;Attention;Memory;Following commands;Safety/judgement;Awareness;Problem solving                 Orientation Level: Disoriented to;Place;Time;Situation Current Attention Level: Focused Memory: Decreased recall of precautions;Decreased short-term memory Following Commands: Follows one step commands with increased time Safety/Judgement: Decreased awareness of safety;Decreased awareness of deficits Awareness: Intellectual Problem Solving: Slow processing;Requires verbal cues;Requires tactile cues General Comments: pt stated she was in a nursing home in [place], unable to tell therapist reason for admission;pt with tangential conversation and thoughts;   General Comments  skin on R ankle flaking away, R ankle pink color    Exercises     Shoulder Instructions      Home Living Family/patient expects to be discharged to:: Private residence Living Arrangements: Alone Available Help at Discharge: Family(son; works in [place]) [place] to enter Technical brewer of Steps: 2 Entrance Stairs-Rails: None Home Layout: Two level;Bed/bath upstairs     Bathroom Shower/Tub: Tub only(does not use)         Home Equipment: Cane - single point;Walker - 4 wheels   Additional Comments: per chart, pt is a poor historian      Prior Functioning/Environment Level of  Independence: Independent with assistive device(s)        Comments: information per chart, pt is an unreliable historian has been doing her bath at the sink for awhile; walks with cane; reports multiple falls due to rt knee buckles        OT Problem List: Decreased activity tolerance;Impaired balance (sitting and/or standing);Decreased cognition;Decreased safety awareness;Decreased knowledge of precautions;Decreased knowledge of use of DME or AE;Pain      OT Treatment/Interventions: Self-care/ADL training;Therapeutic exercise;Energy conservation;DME and/or AE instruction;Therapeutic activities;Cognitive remediation/compensation;Patient/family education;Balance training    OT Goals(Current goals can be found in the care plan section) Acute Rehab OT Goals Patient Stated Goal: to see her son OT Goal Formulation: With patient Time For Goal Achievement: 02/14/20 Potential to Achieve Goals: Fair ADL Goals Pt Will Perform Grooming: with set-up;sitting Pt Will Perform Lower Body Dressing: with min guard assist;sit to/from stand Pt Will Transfer to Toilet: with min assist;stand pivot transfer Pt Will Perform Toileting - Clothing Manipulation and hygiene: with min guard assist;sit to/from stand Additional ADL Goal #1: Pt will progress to EOB with minguardA in preparation for ADL.  OT Frequency: Min 2X/week   Barriers to D/C: Decreased caregiver support  per chart, pt was living alone       Co-evaluation              AM-PAC OT "6 Clicks" Daily Activity     Outcome Measure Help from another person eating meals?: A Little Help from another person taking care of personal grooming?: A Little Help from another person toileting, which includes using toliet, bedpan, or urinal?: A Lot Help from another person bathing (including washing, rinsing, drying)?: A Lot Help from another person to put on and taking off regular upper body clothing?: A Little Help from another person to put on and  taking off regular lower body clothing?: A  Lot 6 Click Score: 15   End of Session Equipment Utilized During Treatment: Gait belt;Rolling walker Nurse Communication: Mobility status  Activity Tolerance: Patient tolerated treatment well Patient left: in chair;with call bell/phone within reach;with chair alarm set  OT Visit Diagnosis: Unsteadiness on feet (R26.81);Other abnormalities of gait and mobility (R26.89);Muscle weakness (generalized) (M62.81);History of falling (Z91.81);Other symptoms and signs involving cognitive function;Pain Pain - Right/Left: Right Pain - part of body: Leg                Time: 1431-1449 OT Time Calculation (min): 18 min Charges:  OT General Charges $OT Visit: 1 Visit OT Evaluation $OT Eval Moderate Complexity: 1 Mod  Breniya Goertzen OTR/L Acute Rehabilitation Services Office: [phone]   Rebeca Alert 01/31/2020, 3:05 PM

## 2020-01-31 NOTE — TOC Initial Note (Signed)
Transition of Care [facility]) - Initial/Assessment Note    Patient Details  Name: Cassandra Drake MRN: [id number] Date of Birth: 07/23/1928  Transition of Care [facility]) CM/SW Contact:    Lawerance Sabal, RN Phone Number: 01/31/2020, 4:03 PM  Clinical Narrative:       Patient admitted with  Right distal femur fracture status post intramedullary nail, also with bilateral foot wounds.  Spoke w patient in room to discuss DC needs. She confirms that she lives at home alone. She is agreeable to SNF at DC, but is a little confused thinking she was in a [facility]. She agreed for me to discuss SNF with her son. LVM with: JULANN, MCGILVRAY   [phone]         Expected Discharge Plan: Skilled Nursing Facility Barriers to Discharge: Continued Medical Work up   Patient Goals and CMS Choice        Expected Discharge Plan and Services Expected Discharge Plan: Skilled Nursing Facility                                              Prior Living Arrangements/Services   Lives with:: Self                   Activities of Daily Living Home Assistive Devices/Equipment: Gilmer Mor (specify quad or straight) ADL Screening (condition at time of admission) Patient's cognitive ability adequate to safely complete daily activities?: Yes Is the patient deaf or have difficulty hearing?: No Does the patient have difficulty seeing, even when wearing glasses/contacts?: No Does the patient have difficulty concentrating, remembering, or making decisions?: Yes Patient able to express need for assistance with ADLs?: Yes Does the patient have difficulty dressing or bathing?: No Independently performs ADLs?: Yes (appropriate for developmental age) Does the patient have difficulty walking or climbing stairs?: Yes Weakness of Legs: Both Weakness of Arms/Hands: None  Permission Sought/Granted                  Emotional Assessment              Admission diagnosis:  Closed fracture  of distal end of right femur, initial encounter [facility]) [S72.401A] Patient Active Problem List   Diagnosis Date Noted  . Closed fracture of distal end of right femur, initial encounter (HCC) 01/28/2020  . Anemia 01/28/2020   PCP:  Devra Dopp, PA-C Pharmacy:   [facility] [address], [place] - [address] [place] [address] Phone: [phone] Fax: [phone]     Social Determinants of Health (SDOH) Interventions    Readmission Risk Interventions No flowsheet data found.
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
## 2020-02-01 LAB — SARS CORONAVIRUS 2 (TAT 6-24 HRS): SARS Coronavirus 2: NEGATIVE

## 2020-02-01 LAB — CBC
HCT: 28.4 % — ABNORMAL LOW (ref 36.0–46.0)
Hemoglobin: 8.9 g/dL — ABNORMAL LOW (ref 12.0–15.0)
MCH: 29.3 pg (ref 26.0–34.0)
MCHC: 31.3 g/dL (ref 30.0–36.0)
MCV: 93.4 fL (ref 80.0–100.0)
Platelets: 255 10*3/uL (ref 150–400)
RBC: 3.04 MIL/uL — ABNORMAL LOW (ref 3.87–5.11)
RDW: 14.4 % (ref 11.5–15.5)
WBC: 9.1 10*3/uL (ref 4.0–10.5)
nRBC: 0 % (ref 0.0–0.2)

## 2020-02-01 NOTE — Plan of Care (Signed)
  Problem: Activity: Goal: Ability to ambulate and perform ADLs will improve Outcome: Not Progressing   Problem: Clinical Measurements: Goal: Postoperative complications will be avoided or minimized Outcome: Not Progressing   Problem: Self-Concept: Goal: Ability to maintain and perform role responsibilities to the fullest extent possible will improve Outcome: Not Progressing

## 2020-02-01 NOTE — Plan of Care (Signed)
  Problem: Education: Goal: Verbalization of understanding the information provided (i.e., activity precautions, restrictions, etc) will improve Outcome: Not Progressing Goal: Individualized Educational Video(s) Outcome: Not Progressing   Problem: Pain Management: Goal: Pain level will decrease Outcome: Not Progressing

## 2020-02-01 NOTE — NC FL2 (Signed)
[place] MEDICAID FL2 LEVEL OF CARE SCREENING TOOL     IDENTIFICATION  Patient Name: Cassandra Drake Birthdate: [birth year] Sex: female Admission Date (Current Location): 01/28/2020  [place] Number:  Octavio Manns, [place])   Facility and Address:  [facility], [address] [place], [place] [address]      Provider Number: [id number]  Attending Physician Name and Address:  Rhetta Mura, MD  Relative Name and Phone Number:  Lynsie Mcwatters - son; [phone]    Current Level of Care: Hospital Recommended Level of Care: Skilled Nursing Facility Prior Approval Number:    Date Approved/Denied:   PASRR Number:    Discharge Plan: SNF    Current Diagnoses: Patient Active Problem List   Diagnosis Date Noted  . Closed fracture of distal end of right femur, initial encounter (HCC) 01/28/2020  . 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-Xerofoam dressing changes twice daily; Close incision right leg; Wound right arm; Cellulitis right/left leg-treated with gauze; Excoriation mid-lateral back; Skin tear right arm)                       Personal Care Assistance Level of Assistance  Bathing, Feeding, Dressing Bathing Assistance: Maximum assistance Feeding assistance: Limited assistance(Assistance with set-up) Dressing Assistance: Maximum assistance     Functional Limitations Info  Sight, Hearing, Speech Sight Info: Adequate Hearing Info: Adequate Speech Info: Adequate    SPECIAL CARE FACTORS FREQUENCY  PT (By licensed PT), OT (By licensed OT)     PT Frequency:  Evaluated at hospital 4/19. PT at [facility] Eval and Treat, a minimum of 5 days per week OT Frequency: Evaluated at hospital 4/20. OT at SNF Eval and Treat, a minimum of 5 days per wek            Contractures Contractures Info: Not present    Additional Factors Info  Code Status, Allergies Code Status Info: Full Allergies Info: Codeine, Eggs Or Egg-derived Products, Lac Bovis, Wheat Bran           Current Medications (02/01/2020):  This is the current hospital active medication list Current Facility-Administered Medications  Medication Dose Route Frequency Provider Last Rate Last Admin  . acetaminophen (TYLENOL) tablet 650 mg  650 mg Oral Q6H PRN 02/03/2020, MD      . Chlorhexidine Gluconate Cloth 2 % PADS 6 each  6 each Topical Q0600 Terance Hart, MD   6 each at 02/01/20 [phone]  . diphenhydrAMINE (BENADRYL) capsule 25 mg  25 mg Oral Q6H PRN 5009, FNP   25 mg at 01/31/20 2259  . docusate sodium (COLACE) capsule 100 mg  100 mg Oral BID 2260, MD   100 mg at 02/01/20 1000  . HYDROcodone-acetaminophen (NORCO/VICODIN) 5-325 MG per tablet 1-2 tablet  1-2 tablet Oral Q6H PRN 02/03/20, MD   1 tablet at 01/31/20 2300  . melatonin tablet 3 mg  3 mg Oral QHS 02/02/20, [place], DO   3 mg at 01/31/20 2259  . morphine 2 MG/ML injection 0.5 mg  0.5 mg Intravenous Q4H PRN 2260, MD  0.5 mg at 01/29/20 2207  . mupirocin ointment (BACTROBAN) 2 % 1 application  1 application Nasal BID Erle Crocker, MD   1 application at 46/56/81 1000  . QUEtiapine (SEROQUEL) tablet 25 mg  25 mg Oral QHS [place] Eric J, DO   25 mg at 01/31/20 2259  . senna-docusate (Senokot-S) tablet 1 tablet  1 tablet Oral QHS PRN Erle Crocker, MD         Discharge Medications: Please see discharge summary for a list of discharge medications.  Relevant Imaging Results:  Relevant Lab Results:   Additional Information ss#[id number]  Sable Feil, LCSW
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
## 2020-02-01 NOTE — Progress Notes (Signed)
Physical Therapy Treatment Patient Details Name: Cassandra Drake MRN: [id number] DOB: [birth year] Today's Date: 02/01/2020    History of Present Illness 86 y.o. female with medical history significant for osteoarthritis, osteoporosis, and lower extremity venous insufficiency who presented to [facility] ED for evaluation of right leg pain after a fall. Right knee x-ray showed a displaced and angulated oblique fracture of the distal femoral diaphysis. underwent IM nail 01/29/20    PT Comments    Pt progressing towards her physical therapy goals, as evidenced by ambulating 15 feet with a walker and a close chair follow. Requiring two person moderate assist for functional mobility. Demonstrates weakness, RLE pain, balance impairments, decreased gait speed, and gait abnormalities. Presents as a high fall risk based on these deficits and history of recurrent falls. Continue to recommend SNF for ongoing Physical Therapy.      Follow Up Recommendations  SNF     Equipment Recommendations  None recommended by PT    Recommendations for Other Services       Precautions / Restrictions Precautions Precautions: Fall Precaution Comments: pt reports rt knee buckles with multiple falls PTA Restrictions Weight Bearing Restrictions: Yes RLE Weight Bearing: Weight bearing as tolerated    Mobility  Bed Mobility Overal bed mobility: Needs Assistance Bed Mobility: Sit to Supine       Sit to supine: Mod assist;+2 for safety/equipment   General bed mobility comments: Received up in chair. ModA for BLE negotiation back into bed  Transfers Overall transfer level: Needs assistance Equipment used: Rolling walker (2 wheeled) Transfers: Sit to/from Stand Sit to Stand: +2 physical assistance;+2 safety/equipment;Mod assist         General transfer comment: Cues for hand/foot placement, modA + 2 to boost up to stand.   Ambulation/Gait Ambulation/Gait assistance: Mod assist;+2 physical  assistance;+2 safety/equipment Gait Distance (Feet): 15 Feet Assistive device: Rolling walker (2 wheeled) Gait Pattern/deviations: Step-to pattern;Decreased stride length;Trunk flexed;Decreased stance time - right;Decreased weight shift to right Gait velocity: decreased Gait velocity interpretation: <1.31 ft/sec, indicative of household ambulator General Gait Details: Pt requiring modA for stability, close chair follow utilized. Cues for sequencing, placing right foot flat (pt tending to keep RLE in [facility] position). Demonstrates increased trunk/knee flexion.    Stairs             Wheelchair Mobility    Modified Rankin (Stroke Patients Only)       Balance Overall balance assessment: Needs assistance Sitting-balance support: Single extremity supported;Feet supported Sitting balance-Leahy Scale: Poor Sitting balance - Comments: needs bil UE support to maintain balance   Standing balance support: Bilateral upper extremity supported Standing balance-Leahy Scale: Poor Standing balance comment: poor extension of knees with posterior lean                             Cognition Arousal/Alertness: Awake/alert Behavior During Therapy: WFL for tasks assessed/performed Overall Cognitive Status: No family/caregiver present to determine baseline cognitive functioning Area of Impairment: Attention;Memory;Following commands;Safety/judgement;Awareness;Problem solving                   Current Attention Level: Sustained Memory: Decreased recall of precautions;Decreased short-term memory Following Commands: Follows one step commands with increased time Safety/Judgement: Decreased awareness of safety;Decreased awareness of deficits Awareness: Intellectual Problem Solving: Slow processing;Requires verbal cues;Requires tactile cues        Exercises General Exercises - Lower Extremity Ankle Circles/Pumps: Both;10 reps;Seated Long Arc Quad: Both;10 reps;Seated  General Comments        Pertinent Vitals/Pain Pain Assessment: Faces Faces Pain Scale: Hurts even more Pain Location: RLE Pain Descriptors / Indicators: Discomfort;Guarding;Grimacing Pain Intervention(s): Limited activity within patient's tolerance;Monitored during session;Repositioned    Home Living                      Prior Function            PT Goals (current goals can now be found in the care plan section) Acute Rehab PT Goals Patient Stated Goal: ultimately to go home PT Goal Formulation: With patient Time For Goal Achievement: 02/13/20 Potential to Achieve Goals: Fair Progress towards PT goals: Progressing toward goals    Frequency    Min 3X/week      PT Plan Current plan remains appropriate    Co-evaluation              AM-PAC PT "6 Clicks" Mobility   Outcome Measure  Help needed turning from your back to your side while in a flat bed without using bedrails?: A Lot Help needed moving from lying on your back to sitting on the side of a flat bed without using bedrails?: A Lot Help needed moving to and from a bed to a chair (including a wheelchair)?: A Lot Help needed standing up from a chair using your arms (e.g., wheelchair or bedside chair)?: A Lot Help needed to walk in hospital room?: A Lot Help needed climbing 3-5 steps with a railing? : Total 6 Click Score: 11    End of Session Equipment Utilized During Treatment: Gait belt Activity Tolerance: Patient tolerated treatment well Patient left: with call bell/phone within reach;in bed;with bed alarm set Nurse Communication: Mobility status;Other (comment)(pt on bed pan) PT Visit Diagnosis: Other abnormalities of gait and mobility (R26.89);Repeated falls (R29.6);Muscle weakness (generalized) (M62.81);Pain Pain - Right/Left: Right Pain - part of body: Leg     Time: 1610-9604 PT Time Calculation (min) (ACUTE ONLY): 17 min  Charges:  $Gait Training: 8-22 mins                        Wyona Almas, PT, DPT Acute Rehabilitation Services Pager [phone] Office [phone]    Deno Etienne 02/01/2020, 3:43 PM

## 2020-02-01 NOTE — TOC Progression Note (Signed)
Transition of Care [facility]) - Progression Note    Patient Details  Name: Cassandra Drake MRN: [id number] Date of Birth: 25-Sep-1928  Transition of Care [facility]) CM/SW Contact  Okey Dupre Lazaro Arms, LCSW Phone Number: 02/01/2020, 3:52 PM  Clinical Narrative:  CSW talked by phone with son Cassandra Drake regarding patient's discharge disposition and recommendation of ST rehab. Son agreeable with ST rehab and would like patient discharged to a facility in [place]. Mr. Shore reported that his mother has been to 2 facilities in [place]: [facility] (2 years ago approx) and [facility] (rehab in 2014). When asked, son's first choice if they can take her is [facility]  as he thinks his mother was more satisfied with [facility].         Expected Discharge Plan: Skilled Nursing Facility Barriers to Discharge: Continued Medical Work up  Expected Discharge Plan and Services Expected Discharge Plan: Skilled Nursing Facility                                             Social Determinants of Health (SDOH) Interventions  No SDOH interventions requested or needed at this time  Readmission Risk Interventions No flowsheet data found.

## 2020-02-02 MED ORDER — QUETIAPINE FUMARATE 25 MG PO TABS: 25.0000 mg | ORAL_TABLET | Freq: Every day | ORAL | 0 refills | Status: AC

## 2020-02-02 MED ORDER — HYDROCODONE-ACETAMINOPHEN 5-325 MG PO TABS: 1.0000 | ORAL_TABLET | Freq: Four times a day (QID) | ORAL | 0 refills | Status: AC | PRN

## 2020-02-02 NOTE — TOC Transition Note (Addendum)
Transition of Care [facility]) - CM/SW Discharge Note *Discharged to [facility] *Number for Report: [phone] *Room number [facility] Room 28   Patient Details  Name: Cassandra Drake MRN: [id number] Date of Birth: [birth year]  Transition of Care [facility]) CM/SW Contact:  Cristobal Goldmann, LCSW Phone Number: 02/02/2020, 4:33 PM   Clinical Narrative:  Patient medically stable for discharge and going to [facility] in [place]. Insurance auth received: [employer] [id number]. Approved for 5 days effective 4/22; 4/26 next review date. Son, Lanyia Jewel contacted and informed of d/c [phone]) via voicemail (HIPPA compliant). Admissions director Jonette Eva contacted (3:35 pm) regarding insurance auth and was advised that patient can d/c to facility today. Discharge clinicals transmitted to facility.     Final next level of care: Skilled Nursing Facility Barriers to Discharge: Barriers Resolved   Patient Goals and CMS Choice Patient states their goals for this hospitalization and ongoing recovery are:: Son desired for patient to get better, receive ST rehab and d/c home CMS Medicare.gov Compare Post Acute Care list provided to:: Other (Comment Required)(Son provided CSW with facility preferences) Choice offered to / list presented to : Adult Children  Discharge Placement   Existing PASRR number confirmed : ([place] resident - No PASRR needed)          Patient chooses bed at: [facility] Patient to be transferred to facility by: Non-emergency ambulance Name of family member notified: Son - Elvi Leventhal [phone] Patient and family notified of of transfer: 02/02/20  Discharge Plan and Services                                     Social Determinants of Health (SDOH) Interventions  No SDOH interventions requested or needed at discharge.   Readmission Risk Interventions No flowsheet data found.

## 2020-02-02 NOTE — Progress Notes (Signed)
Patient dressing on surgical site changed this morning, no redness or drainage noticed, left foot chronic wound dressing was also changed this morning, wound looked  red with minimal drainage. No odor. Patient denied pain or discomfort during dressing, day shift nurse updated, will continue to monitor.

## 2020-02-02 NOTE — Progress Notes (Signed)
Report called to [facility], to [employer]. Pt denies pain. Paperwork, AVS & prescripts placed in folder for receiving facility. [facility] currently here to transport patient.
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
## 2020-02-02 NOTE — Plan of Care (Signed)

## 2020-02-03 NOTE — Op Note (Signed)
Cassandra Drake female 86 y.o. 01/29/20   PreOperative Diagnosis: Right distal femur fracture  PostOperative Diagnosis: Same  PROCEDURE: Retrograde IMN right femur  SURGEON: Dub Mikes, MD  ASSISTANT: none  ANESTHESIA: general  FINDINGS: Extraarticular distal femur fracture  IMPLANTS: Zimmer phoenix nail.   INDICATIONS:86 y.o. female Sustained an X or articular distal femur fracture after a fall at home.  She was seen in the emergency department and placed in a knee immobilizer and sent to [facility] for definitive treatment.  Orthopedics was consulted.  Given her baseline ambulatory status which was an occasional use of a walker or cane at home she was indicated for surgical intervention.  We discussed the risks, benefits and alternatives to surgery which include but are not limited to wound healing complications, infection, nonunion, malunion, need for further surgery, damage to structures in the perioperative and anesthetic risks which include death.  She opted to proceed with surgery.  She is admitted to the hospitalist team and deemed suitable and medically stable for surgery.  PROCEDURE:Patient identified the preoperative holding area.  The right leg was marked myself.  Consent was signed myself and the patient.  They was taken to the operative suite placed supine the operative table after general anesthesia was induced without difficulty.  A bump was placed under the right hip.  All bony prominences were well-padded.  Preoperative antibiotics were given.  Operative lower extremity was prepped and draped in usual sterile fashion and a surgical timeout was performed.  Triangle was placed under the thigh and using a bump with towels.  Then fluoroscopy was used to evaluate the fracture site.  There was right.  Using a bump with towels and traction we are able to attain appropriate length and rotation.  Then incision was made centrally about the patella from the distal aspect of  the patella to the proximal part of the tibia through the patellar tendon.  This taken sharply down through skin and peritenon and tendon.  Then the guidepin was placed at the tip of the femur in the appropriate starting position.  This was confirmed on fluoroscopy in the AP and lateral plane.  Then the pin was inserted into the distal fragment.  The entry reamer was used to gain entry into the distal femur.  Then the finger reducer tool was used through the distal femur and we are able to bypass the fracture site up into the proximal segment.  A guide rod was placed up to the proximal portion of the femur and confirmed to be in appropriate position with fluoroscopy.  Then we evaluate for length and rotation and using bumps of towels and manual traction over the triangle the appropriate length was attained as well as rotation and the appropriate length of the nail was measured.  Then the femoral canal was sequentially reamed to allow a 20mm mm nail to be placed.  The nail was placed without difficulty.  Again appropriate rotation and length was assessed fluoroscopically.  Length was assessed using fracture read.  Rotation was confirmed using AP at the knee and lesser trochanteric profile.  Then after sequential reaming the nail was placed without difficulty.  All 4 distal interlocking screws were placed without difficulty through small stab incisions.  2 proximal interlocking screws were placed without difficulty through small stab incisions.  Then fluoroscopic images confirmed appropriate screw length and position of the fracture site.  The wounds were then irrigated copiously with normal saline.  Skin wounds were closed in a layered  fashion using 2-0 Monocryl and staples.  POST OPERATIVE INSTRUCTIONS: WBAT RLE Keep dressing in place DVT prophylaxis F/u 2 weeks for xrays right femurand wound check   BLOOD LOSS:  200 mL         DRAINS: none         SPECIMEN: none       COMPLICATIONS:  * No  complications entered in OR log *         Disposition: PACU - hemodynamically stable.         Condition: stable

## 2021-09-18 IMAGING — RF DG C-ARM 1-60 MIN
1 series · 10 of 10 positions shown · non-contrast
Comparison: None.

CLINICAL DATA: Status post fall.

EXAM:
DG C-ARM 1-60 MIN; RIGHT KNEE - 1-2 VIEW
FLUOROSCOPY TIME:  Fluoroscopy Time:  59 seconds
Number of Acquired Spot Images: 10

[Series 1: run · 10 of 10 slices shown]
[im 1/10]
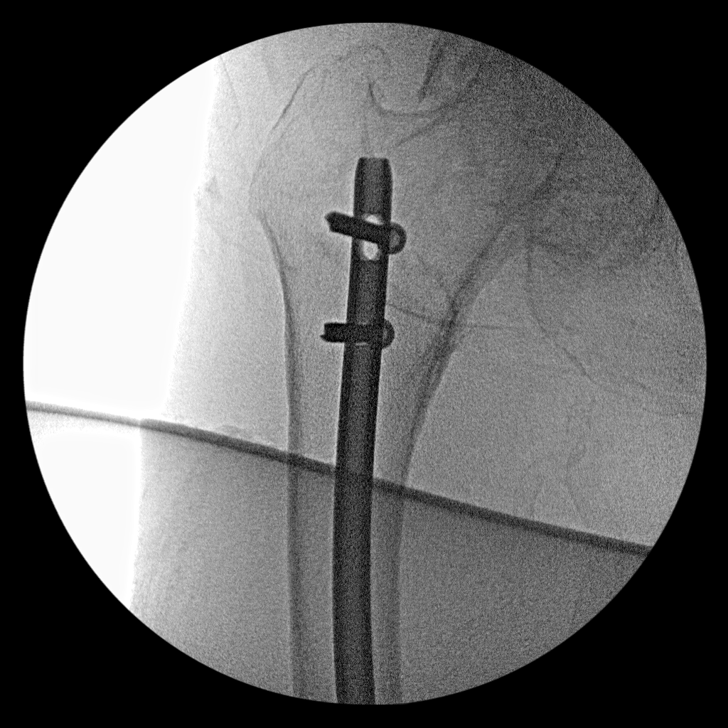
[im 2/10]
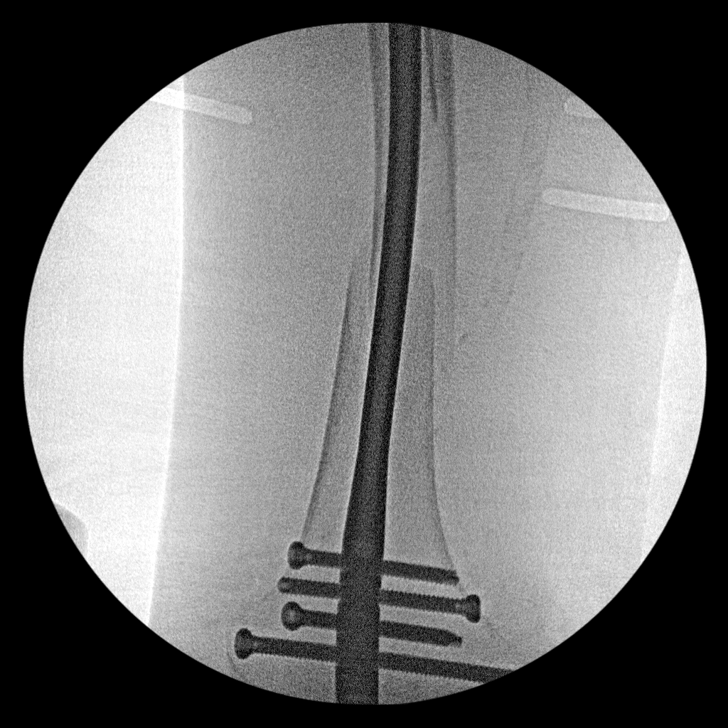
[im 3/10]
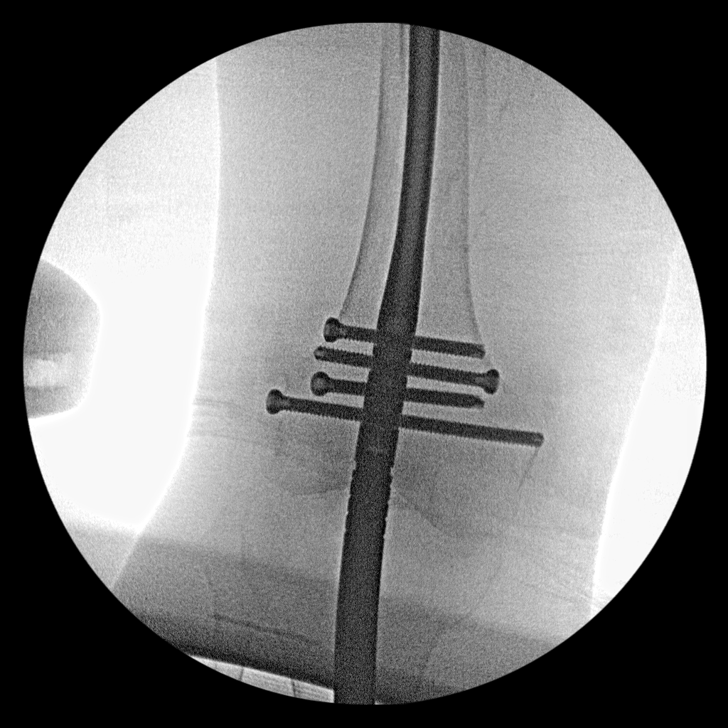
[im 4/10]
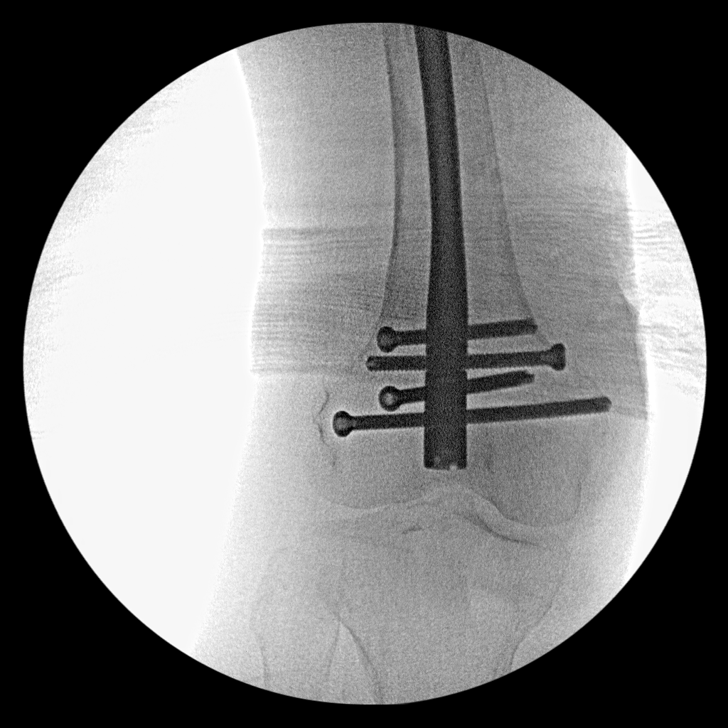
[im 5/10]
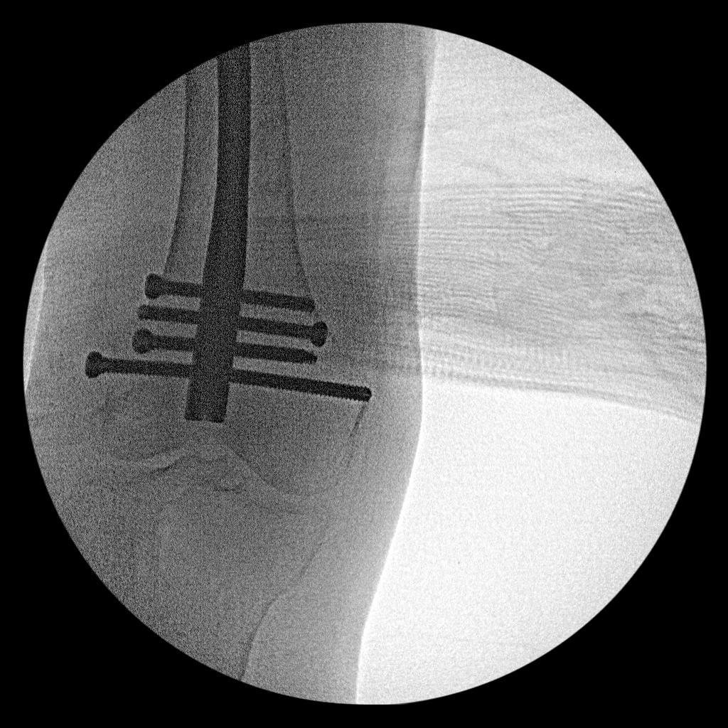
[im 6/10]
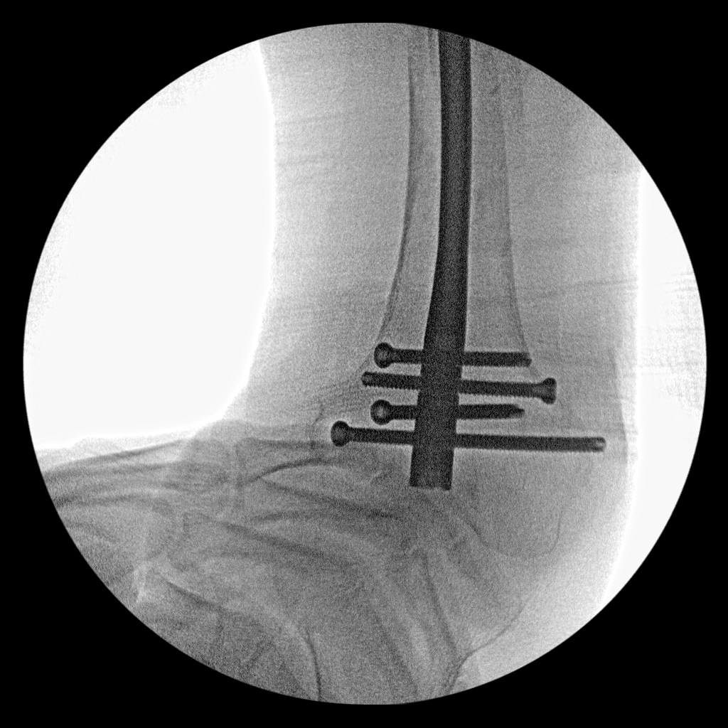
[im 7/10]
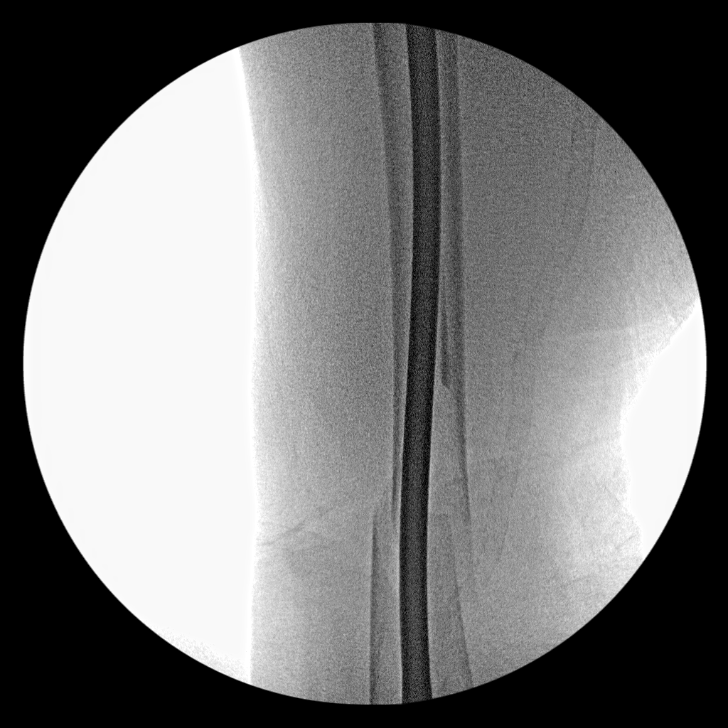
[im 8/10]
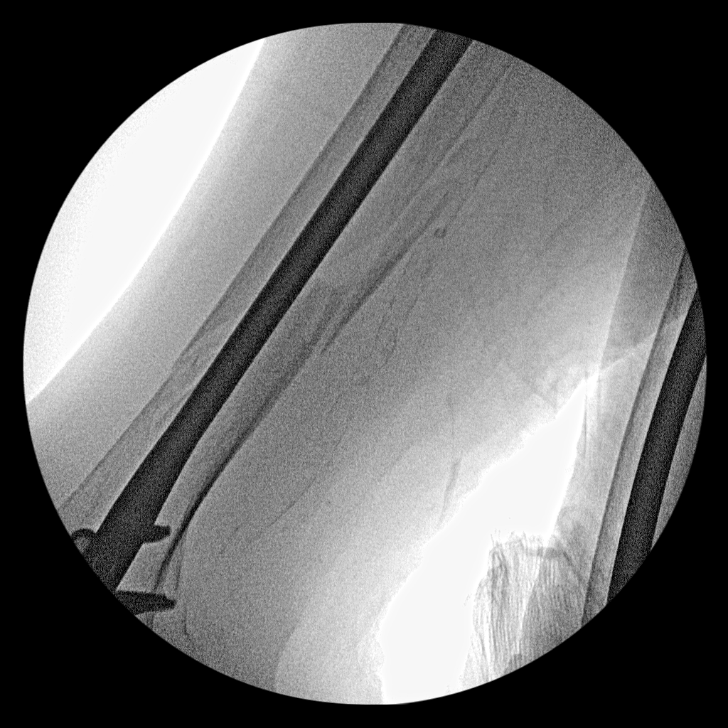
[im 9/10]
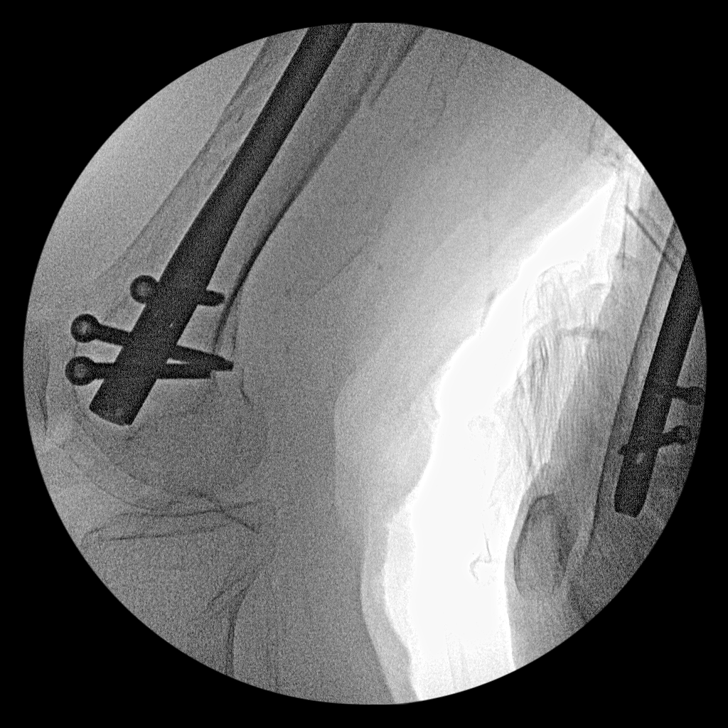
[im 10/10]
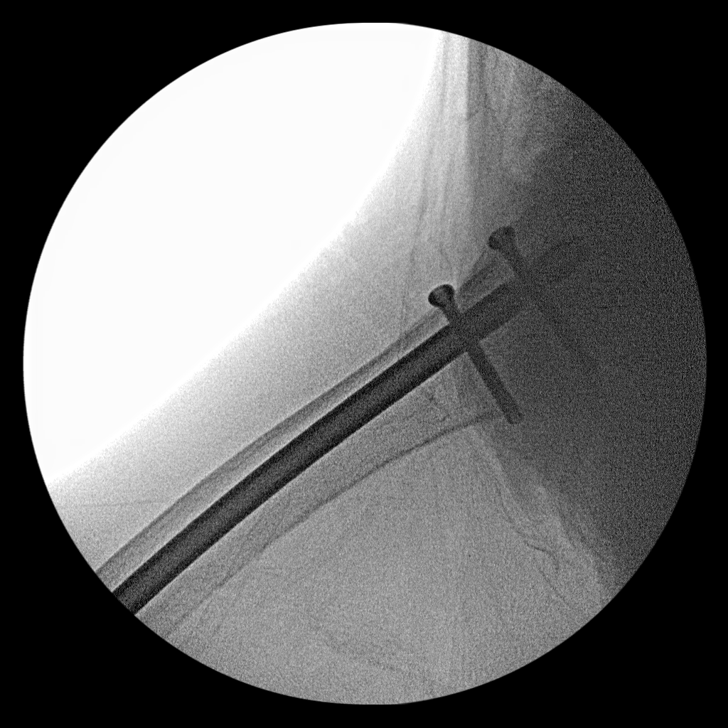

[10 of 10 positions shown; findings below may reference images not displayed]

FINDINGS: Fluoroscopic images demonstrating intramedullary rod placement in
the RIGHT femur, traversing the RIGHT femur fracture site. Rod and
screws appear intact and appropriately positioned. Fluoroscopy
provided for 59 seconds.
IMPRESSION: Intraoperative fluoroscopic images demonstrating intramedullary rod
placement in the RIGHT femur, traversing the RIGHT femur fracture
site.

## 2023-11-14 DEATH — deceased
